# Patient Record
Sex: Female | Born: 1975 | Race: White | Hispanic: No | Marital: Single | Smoking: Current every day smoker
Health system: Southern US, Community
[De-identification: ages and names within clinical notes are randomized; demographics above are authoritative.]

## PROBLEM LIST (undated history)

## (undated) DIAGNOSIS — R519 Headache, unspecified: Secondary | ICD-10-CM

## (undated) DIAGNOSIS — T8859XA Other complications of anesthesia, initial encounter: Secondary | ICD-10-CM

## (undated) DIAGNOSIS — F419 Anxiety disorder, unspecified: Secondary | ICD-10-CM

## (undated) DIAGNOSIS — I10 Essential (primary) hypertension: Secondary | ICD-10-CM

## (undated) DIAGNOSIS — R51 Headache: Secondary | ICD-10-CM

## (undated) DIAGNOSIS — T4145XA Adverse effect of unspecified anesthetic, initial encounter: Secondary | ICD-10-CM

## (undated) DIAGNOSIS — E039 Hypothyroidism, unspecified: Secondary | ICD-10-CM

## (undated) HISTORY — PX: CHOLECYSTECTOMY: SHX55

## (undated) HISTORY — DX: Anxiety disorder, unspecified: F41.9

## (undated) HISTORY — DX: Hypothyroidism, unspecified: E03.9

## (undated) HISTORY — PX: TUBAL LIGATION: SHX77

---
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
## 2014-12-07 ENCOUNTER — Encounter (HOSPITAL_COMMUNITY): Payer: Self-pay | Admitting: *Deleted

## 2014-12-07 ENCOUNTER — Emergency Department (HOSPITAL_COMMUNITY)
Admission: EM | Admit: 2014-12-07 | Discharge: 2014-12-08 | Disposition: A | Discharge status: Home / Self Care | Payer: Medicaid Other | Attending: Emergency Medicine | Admitting: Emergency Medicine

## 2014-12-07 DIAGNOSIS — Z3A14 14 weeks gestation of pregnancy: Secondary | ICD-10-CM | POA: Insufficient documentation

## 2014-12-07 DIAGNOSIS — F17210 Nicotine dependence, cigarettes, uncomplicated: Secondary | ICD-10-CM | POA: Diagnosis not present

## 2014-12-07 DIAGNOSIS — O9989 Other specified diseases and conditions complicating pregnancy, childbirth and the puerperium: Secondary | ICD-10-CM | POA: Insufficient documentation

## 2014-12-07 DIAGNOSIS — M549 Dorsalgia, unspecified: Secondary | ICD-10-CM | POA: Diagnosis not present

## 2014-12-07 DIAGNOSIS — O99331 Smoking (tobacco) complicating pregnancy, first trimester: Secondary | ICD-10-CM | POA: Insufficient documentation

## 2014-12-07 DIAGNOSIS — O99891 Other specified diseases and conditions complicating pregnancy: Secondary | ICD-10-CM

## 2014-12-07 LAB — URINALYSIS, ROUTINE W REFLEX MICROSCOPIC
BILIRUBIN URINE: NEGATIVE
GLUCOSE, UA: NEGATIVE mg/dL
Hgb urine dipstick: NEGATIVE
Ketones, ur: NEGATIVE mg/dL
LEUKOCYTES UA: NEGATIVE
Nitrite: NEGATIVE
PH: 7 (ref 5.0–8.0)
Protein, ur: NEGATIVE mg/dL
SPECIFIC GRAVITY, URINE: 1.01 (ref 1.005–1.030)
Urobilinogen, UA: 0.2 mg/dL (ref 0.0–1.0)

## 2014-12-07 LAB — BASIC METABOLIC PANEL
Anion gap: 4 — ABNORMAL LOW (ref 5–15)
BUN: 8 mg/dL (ref 6–23)
CO2: 26 mmol/L (ref 19–32)
CREATININE: 0.55 mg/dL (ref 0.50–1.10)
Calcium: 8.6 mg/dL (ref 8.4–10.5)
Chloride: 102 mmol/L (ref 96–112)
GFR calc Af Amer: >90 mL/min (ref >90)
GLUCOSE: 89 mg/dL (ref 70–99)
Potassium: 4.3 mmol/L (ref 3.5–5.1)
Sodium: 132 mmol/L — ABNORMAL LOW (ref 135–145)

## 2014-12-07 LAB — CBC WITH DIFFERENTIAL/PLATELET
BASOS ABS: 0 10*3/uL (ref 0.0–0.1)
Basophils Relative: 0 % (ref 0–1)
Eosinophils Absolute: 0.2 10*3/uL (ref 0.0–0.7)
Eosinophils Relative: 1 % (ref 0–5)
HEMATOCRIT: 33.5 % — AB (ref 36.0–46.0)
Hemoglobin: 11.5 g/dL — ABNORMAL LOW (ref 12.0–15.0)
LYMPHS PCT: 21 % (ref 12–46)
Lymphs Abs: 2.9 10*3/uL (ref 0.7–4.0)
MCH: 31.3 pg (ref 26.0–34.0)
MCHC: 34.3 g/dL (ref 30.0–36.0)
MCV: 91 fL (ref 78.0–100.0)
MONO ABS: 0.8 10*3/uL (ref 0.1–1.0)
Monocytes Relative: 6 % (ref 3–12)
Neutro Abs: 9.9 10*3/uL — ABNORMAL HIGH (ref 1.7–7.7)
Neutrophils Relative %: 72 % (ref 43–77)
Platelets: 217 10*3/uL (ref 150–400)
RBC: 3.68 MIL/uL — AB (ref 3.87–5.11)
RDW: 13.4 % (ref 11.5–15.5)
WBC: 13.8 10*3/uL — AB (ref 4.0–10.5)

## 2014-12-07 LAB — PREGNANCY, URINE: PREG TEST UR: POSITIVE — AB

## 2014-12-07 MED ORDER — OXYCODONE-ACETAMINOPHEN 5-325 MG PO TABS
1.0000 | ORAL_TABLET | Freq: Once | ORAL | Status: AC
  Administered 2014-12-07: 1 via ORAL
  Filled 2014-12-07: qty 1

## 2014-12-07 MED ORDER — MORPHINE SULFATE 4 MG/ML IJ SOLN: 4.0000 mg | Freq: Once | INTRAMUSCULAR | Status: DC

## 2014-12-07 MED ORDER — ONDANSETRON 4 MG PO TBDP
4.0000 mg | ORAL_TABLET | Freq: Once | ORAL | Status: AC
  Administered 2014-12-07: 4 mg via ORAL
  Filled 2014-12-07: qty 1

## 2014-12-07 MED ORDER — ONDANSETRON HCL 4 MG/2ML IJ SOLN: 4.0000 mg | Freq: Once | INTRAMUSCULAR | Status: DC

## 2014-12-07 NOTE — ED Notes (Signed)
Pt states Urine smells foul, specimen sent to lab

## 2014-12-07 NOTE — ED Notes (Signed)
Pt states she is [redacted] weeks pregnant, has not seen OB yet. Wanting fetal heart tones checked. Seen urine to lab to confirm pregnancy

## 2014-12-07 NOTE — ED Notes (Signed)
Low back pain, "stabbing pain" for 3 days. Pt is  [redacted] weeks pregnant.  No vag bleeding, NV

## 2014-12-07 NOTE — ED Notes (Signed)
MD at bedside with Ultra sound, Fetal HR 122 Per MD

## 2014-12-08 ENCOUNTER — Ambulatory Visit (HOSPITAL_COMMUNITY)
Admit: 2014-12-08 | Discharge: 2014-12-08 | Disposition: A | Discharge status: Home / Self Care | Payer: Medicaid Other | Source: Ambulatory Visit | Attending: Emergency Medicine | Admitting: Emergency Medicine

## 2014-12-08 ENCOUNTER — Other Ambulatory Visit (HOSPITAL_COMMUNITY): Payer: Self-pay | Admitting: Emergency Medicine

## 2014-12-08 DIAGNOSIS — O2302 Infections of kidney in pregnancy, second trimester: Secondary | ICD-10-CM | POA: Insufficient documentation

## 2014-12-08 DIAGNOSIS — R109 Unspecified abdominal pain: Secondary | ICD-10-CM

## 2014-12-08 MED ORDER — HYDROCODONE-ACETAMINOPHEN 5-325 MG PO TABS: 1.0000 | ORAL_TABLET | Freq: Four times a day (QID) | ORAL | Status: DC | PRN

## 2014-12-08 NOTE — ED Notes (Signed)
Patient given discharge instruction, verbalized understand. Patient ambulatory out of the department.  

## 2014-12-08 NOTE — Discharge Instructions (Signed)
You were seen today for back pain during pregnancy. Urinalysis does not show any evidence of infection. Other considerations include kidney stones. He will need an ultrasound of your kidneys which will be arranged for you tomorrow. In the meantime you will be given pain medication for symptomatic relief.  Back Pain in Pregnancy Back pain during pregnancy is common. It happens in about half of all pregnancies. It is important for you and your baby that you remain active during your pregnancy.If you feel that back pain is not allowing you to remain active or sleep well, it is time to see your caregiver. Back pain may be caused by several factors related to changes during your pregnancy.Fortunately, unless you had trouble with your back before your pregnancy, the pain is likely to get better after you deliver. Low back pain usually occurs between the fifth and seventh months of pregnancy. It can, however, happen in the first couple months. Factors that increase the risk of back problems include:   Previous back problems.  Injury to your back.  Having twins or multiple births.  A chronic cough.  Stress.  Job-related repetitive motions.  Muscle or spinal disease in the back.  Family history of back problems, ruptured (herniated) discs, or osteoporosis.  Depression, anxiety, and panic attacks. CAUSES   When you are pregnant, your body produces a hormone called relaxin. This hormonemakes the ligaments connecting the low back and pubic bones more flexible. This flexibility allows the baby to be delivered more easily. When your ligaments are loose, your muscles need to work harder to support your back. Soreness in your back can come from tired muscles. Soreness can also come from back tissues that are irritated since they are receiving less support.  As the baby grows, it puts pressure on the nerves and blood vessels in your pelvis. This can cause back pain.  As the baby grows and gets heavier  during pregnancy, the uterus pushes the stomach muscles forward and changes your center of gravity. This makes your back muscles work harder to maintain good posture. SYMPTOMS  Lumbar pain during pregnancy Lumbar pain during pregnancy usually occurs at or above the waist in the center of the back. There may be pain and numbness that radiates into your leg or foot. This is similar to low back pain experienced by non-pregnant women. It usually increases with sitting for long periods of time, standing, or repetitive lifting. Tenderness may also be present in the muscles along your upper back. Posterior pelvic pain during pregnancy Pain in the back of the pelvis is more common than lumbar pain in pregnancy. It is a deep pain felt in your side at the waistline, or across the tailbone (sacrum), or in both places. You may have pain on one or both sides. This pain can also go into the buttocks and backs of the upper thighs. Pubic and groin pain may also be present. The pain does not quickly resolve with rest, and morning stiffness may also be present. Pelvic pain during pregnancy can be brought on by most activities. A high level of fitness before and during pregnancy may or may not prevent this problem. Labor pain is usually 1 to 2 minutes apart, lasts for about 1 minute, and involves a bearing down feeling or pressure in your pelvis. However, if you are at term with the pregnancy, constant low back pain can be the beginning of early labor, and you should be aware of this. DIAGNOSIS  X-rays of the back  should not be done during the first 12 to 14 weeks of the pregnancy and only when absolutely necessary during the rest of the pregnancy. MRIs do not give off radiation and are safe during pregnancy. MRIs also should only be done when absolutely necessary. HOME CARE INSTRUCTIONS  Exercise as directed by your caregiver. Exercise is the most effective way to prevent or manage back pain. If you have a back problem, it  is especially important to avoid sports that require sudden body movements. Swimming and walking are great activities.  Do not stand in one place for long periods of time.  Do not wear high heels.  Sit in chairs with good posture. Use a pillow on your lower back if necessary. Make sure your head rests over your shoulders and is not hanging forward.  Try sleeping on your side, preferably the left side, with a pillow or two between your legs. If you are sore after a night's rest, your bedmay betoo soft.Try placing a board between your mattress and box spring.  Listen to your body when lifting.If you are experiencing pain, ask for help or try bending yourknees more so you can use your leg muscles rather than your back muscles. Squat down when picking up something from the floor. Do not bend over.  Eat a healthy diet. Try to gain weight within your caregiver's recommendations.  Use heat or cold packs 3 to 4 times a day for 15 minutes to help with the pain.  Only take over-the-counter or prescription medicines for pain, discomfort, or fever as directed by your caregiver. Sudden (acute) back pain  Use bed rest for only the most extreme, acute episodes of back pain. Prolonged bed rest over 48 hours will aggravate your condition.  Ice is very effective for acute conditions.  Put ice in a plastic bag.  Place a towel between your skin and the bag.  Leave the ice on for 10 to 20 minutes every 2 hours, or as needed.  Using heat packs for 30 minutes prior to activities is also helpful. Continued back pain See your caregiver if you have continued problems. Your caregiver can help or refer you for appropriate physical therapy. With conditioning, most back problems can be avoided. Sometimes, a more serious issue may be the cause of back pain. You should be seen right away if new problems seem to be developing. Your caregiver may recommend:  A maternity girdle.  An elastic sling.  A back  brace.  A massage therapist or acupuncture. SEEK MEDICAL CARE IF:   You are not able to do most of your daily activities, even when taking the pain medicine you were given.  You need a referral to a physical therapist or chiropractor.  You want to try acupuncture. SEEK IMMEDIATE MEDICAL CARE IF:  You develop numbness, tingling, weakness, or problems with the use of your arms or legs.  You develop severe back pain that is no longer relieved with medicines.  You have a sudden change in bowel or bladder control.  You have increasing pain in other areas of the body.  You develop shortness of breath, dizziness, or fainting.  You develop nausea, vomiting, or sweating.  You have back pain which is similar to labor pains.  You have back pain along with your water breaking or vaginal bleeding.  You have back pain or numbness that travels down your leg.  Your back pain developed after you fell.  You develop pain on one side of your  back. You may have a kidney stone.  You see blood in your urine. You may have a bladder infection or kidney stone.  You have back pain with blisters. You may have shingles. Back pain is fairly common during pregnancy but should not be accepted as just part of the process. Back pain should always be treated as soon as possible. This will make your pregnancy as pleasant as possible. Document Released: 11/15/2005 Document Revised: 10/30/2011 Document Reviewed: 12/27/2010 [facility] Patient Information 2015 [place], [place]. This information is not intended to replace advice given to you by your health care provider. Make sure you discuss any questions you have with your health care provider.

## 2014-12-08 NOTE — ED Notes (Signed)
U/S appointment made, instruction given to drink 32 oz of water and do not void.

## 2014-12-08 NOTE — ED Provider Notes (Signed)
CSN: [id number]     Arrival date & time 12/07/14  2006 History   First MD Initiated Contact with Patient 12/07/14 2254     Chief Complaint  Patient presents with  . Back Pain     (Consider location/radiation/quality/duration/timing/severity/associated sxs/prior Treatment) HPI  This is a G5 P4 female currently approximately [redacted] weeks pregnant who presents with left lower back pain. Patient reports back pain "some time."  She reports that the pain has been ongoing over the last 2-3 weeks but worsened over the last 3 days. Reports that the pain is stabbing in her left flank. Currently 8 out of 10. She reports a strong smell to her urine but denies any dysuria or hematuria. Denies any fevers. She reports that she has had some associated nausea and vomiting. Pain is sometimes worse with movement. No diarrhea. Patient just moved from [facility] and is establishing OB care. She has not had a ultrasound yet. Denies any vaginal discharge or bleeding. Denies any lower abdominal pain.  Past Medical History  Diagnosis Date  . Pregnant    Past Surgical History  Procedure Laterality Date  . Cholecystectomy     History reviewed. No pertinent family history. History  Substance Use Topics  . Smoking status: Current Every Day Smoker  . Smokeless tobacco: Not on file  . Alcohol Use: No   OB History    Gravida Para Term Preterm AB TAB SAB Ectopic Multiple Living   1              Review of Systems  Constitutional: Negative for fever.  Respiratory: Negative for chest tightness and shortness of breath.   Cardiovascular: Negative for chest pain.  Gastrointestinal: Negative for nausea, vomiting and abdominal pain.  Genitourinary: Positive for flank pain. Negative for dysuria, hematuria, vaginal bleeding, vaginal discharge and pelvic pain.  Musculoskeletal: Negative for back pain.  Neurological: Negative for headaches.  All other systems reviewed and are negative.     Allergies   Ciprofloxacin and Doxycycline  Home Medications   Prior to Admission medications   Medication Sig Start Date End Date Taking? Authorizing Provider  HYDROcodone-acetaminophen (NORCO/VICODIN) 5-325 MG per tablet Take 1 tablet by mouth every 6 (six) hours as needed. 12/08/14   Shon Baton, MD   BP 95/53 mmHg  Pulse 65  Temp(Src) 98.3 F (36.8 C) (Oral)  Resp 20  Ht  (1.575 m)  Wt 137 lb (62.143 kg)  BMI 25.05 kg/m2  SpO2 95%  LMP 08/31/2014 (Exact Date) Physical Exam  Constitutional: She is oriented to person, place, and time. She appears well-developed and well-nourished. No distress.  HENT:  Head: Normocephalic and atraumatic.  Cardiovascular: Normal rate, regular rhythm and normal heart sounds.   No murmur heard. Pulmonary/Chest: Effort normal and breath sounds normal. No respiratory distress. She has no wheezes.  Abdominal: Soft. Bowel sounds are normal. There is no tenderness. There is no rebound.  No abdominal tenderness, uterine fundus palpated just above the pelvic brim, left flank tenderness to palpation  Neurological: She is alert and oriented to person, place, and time.  Skin: Skin is warm and dry.  Psychiatric: She has a normal mood and affect.  Nursing note and vitals reviewed.   ED Course  Procedures (including critical care time)  EMERGENCY DEPARTMENT [place] PREGNANCY "Study: Limited Ultrasound of the Pelvis"  INDICATIONS:Pregnancy(required) Multiple views of the uterus and pelvic cavity are obtained with a multi-frequency probe.  APPROACH:Transabdominal   PERFORMED BY: Myself  IMAGES ARCHIVED?:  No  LIMITATIONS: Emergent procedure  PREGNANCY FREE FLUID: None  PREGNANCY UTERUS FINDINGS:Uterus normal size   FETAL HEART RATE: 122     Labs Review Labs Reviewed  CBC WITH DIFFERENTIAL/PLATELET - Abnormal; Notable for the following:    WBC 13.8 (*)    RBC 3.68 (*)    Hemoglobin 11.5 (*)    HCT 33.5 (*)    Neutro Abs 9.9 (*)    All other  components within normal limits  BASIC METABOLIC PANEL - Abnormal; Notable for the following:    Sodium 132 (*)    Anion gap 4 (*)    All other components within normal limits  PREGNANCY, URINE - Abnormal; Notable for the following:    Preg Test, Ur POSITIVE (*)    All other components within normal limits  URINALYSIS, ROUTINE W REFLEX MICROSCOPIC    Imaging Review No results found.   EKG Interpretation None      MDM   Final diagnoses:  Back pain in pregnancy    Patient presents with back pain during pregnancy. It has been ongoing for several weeks but worsening of the last several days. Is nontoxic on exam and vital signs are reassuring. Patient does have left flank tenderness to palpation. Bedside ultrasound revealed fetal fetal heart rate of 122. Urinalysis without evidence of infection suggestive of pyelonephritis. Lab work is otherwise reassuring. Patient does have a mild leukocytosis but this may be related to normal pregnancy. Considerations include musculoskeletal pain versus possible kidney stone. Will have patient return for a renal ultrasound tomorrow. Patient will be given pain control meantime.  After history, exam, and medical workup I feel the patient has been appropriately medically screened and is safe for discharge home. Pertinent diagnoses were discussed with the patient. Patient was given return precautions.     Shon Batonourtney F Iren Whipp, MD 12/08/14 [phone]

## 2014-12-09 NOTE — ED Notes (Signed)
Pt called and stated she was told by the EDP she could return to work on Friday. States she did not get a note to return to work and request one. Note given for pt to return on Friday

## 2015-07-14 ENCOUNTER — Emergency Department (HOSPITAL_COMMUNITY)
Admission: EM | Admit: 2015-07-14 | Discharge: 2015-07-14 | Disposition: A | Discharge status: Home / Self Care | Payer: Medicaid Other | Attending: Emergency Medicine | Admitting: Emergency Medicine

## 2015-07-14 ENCOUNTER — Encounter (HOSPITAL_COMMUNITY): Payer: Self-pay | Admitting: *Deleted

## 2015-07-14 DIAGNOSIS — J029 Acute pharyngitis, unspecified: Secondary | ICD-10-CM | POA: Diagnosis not present

## 2015-07-14 DIAGNOSIS — J3489 Other specified disorders of nose and nasal sinuses: Secondary | ICD-10-CM | POA: Diagnosis not present

## 2015-07-14 DIAGNOSIS — F17200 Nicotine dependence, unspecified, uncomplicated: Secondary | ICD-10-CM | POA: Diagnosis not present

## 2015-07-14 DIAGNOSIS — H9203 Otalgia, bilateral: Secondary | ICD-10-CM | POA: Insufficient documentation

## 2015-07-14 DIAGNOSIS — H53149 Visual discomfort, unspecified: Secondary | ICD-10-CM | POA: Insufficient documentation

## 2015-07-14 DIAGNOSIS — I10 Essential (primary) hypertension: Secondary | ICD-10-CM | POA: Insufficient documentation

## 2015-07-14 DIAGNOSIS — R51 Headache: Secondary | ICD-10-CM | POA: Diagnosis not present

## 2015-07-14 DIAGNOSIS — R519 Headache, unspecified: Secondary | ICD-10-CM

## 2015-07-14 HISTORY — DX: Essential (primary) hypertension: I10

## 2015-07-14 MED ORDER — TRIAMCINOLONE ACETONIDE 55 MCG/ACT NA AERO: INHALATION_SPRAY | NASAL | Status: DC

## 2015-07-14 MED ORDER — CETIRIZINE HCL 10 MG PO CAPS: 10.0000 mg | ORAL_CAPSULE | Freq: Every day | ORAL | Status: AC | PRN

## 2015-07-14 MED ORDER — METOCLOPRAMIDE HCL 5 MG/ML IJ SOLN
10.0000 mg | Freq: Once | INTRAMUSCULAR | Status: AC
  Administered 2015-07-14: 10 mg via INTRAMUSCULAR
  Filled 2015-07-14: qty 2

## 2015-07-14 MED ORDER — KETOROLAC TROMETHAMINE 60 MG/2ML IM SOLN
60.0000 mg | Freq: Once | INTRAMUSCULAR | Status: AC
  Administered 2015-07-14: 60 mg via INTRAMUSCULAR
  Filled 2015-07-14: qty 2

## 2015-07-14 MED ORDER — DIPHENHYDRAMINE HCL 50 MG/ML IJ SOLN
25.0000 mg | Freq: Once | INTRAMUSCULAR | Status: AC
  Administered 2015-07-14: 25 mg via INTRAMUSCULAR
  Filled 2015-07-14: qty 1

## 2015-07-14 NOTE — ED Notes (Signed)
Pt c/o headache and earache x 2 days

## 2015-07-14 NOTE — ED Provider Notes (Signed)
CSN: [id number]     Arrival date & time 07/14/15  0025 History   First MD Initiated Contact with Patient 07/14/15 0130    Chief Complaint  Patient presents with  . URI     (Consider location/radiation/quality/duration/timing/severity/associated sxs/prior Treatment) HPI patient states a couple days ago she started getting headache and she indicates she is having pressure pain over her frontal and maxillary sinuses. She states if she bends over the pain gets a lot worse and starts pounding harder. She states she's having photophobia and noise sensitivity. She states even walking makes her head pounding and throbbing more. She states nothing makes it feel better. She states she's having bilateral ear pain today. She denies coughing, sneezing, rhinorrhea, nausea, vomiting, diarrhea, or fever. She states her nose is stuffy and she feels like she can't breathe through her nose. She has a mild sore throat. She states she has taken ibuprofen and Tylenol without relief.   PCP Dr Lysbeth GalasNyland  Past Medical History  Diagnosis Date  . Pregnant   . Hypertension    Past Surgical History  Procedure Laterality Date  . Cholecystectomy    . Cesarean section      x2   History reviewed. No pertinent family history. Social History  Substance Use Topics  . Smoking status: Current Every Day Smoker -- 0.50 packs/day  . Smokeless tobacco: None  . Alcohol Use: No   employed  OB History    Gravida Para Term Preterm AB TAB SAB Ectopic Multiple Living   1              Review of Systems  All other systems reviewed and are negative.     Allergies  Ciprofloxacin and Doxycycline  Home Medications   Prior to Admission medications   Medication Sig Start Date End Date Taking? Authorizing Provider  Cetirizine HCl (ZYRTEC ALLERGY) 10 MG CAPS Take 1 capsule (10 mg total) by mouth daily as needed. 07/14/15   Devoria AlbeIva Scorpio Fortin, MD  HYDROcodone-acetaminophen (NORCO/VICODIN) 5-325 MG per tablet Take 1 tablet by mouth  every 6 (six) hours as needed. 12/08/14   Shon Batonourtney F Horton, MD  triamcinolone (NASACORT AQ) 55 MCG/ACT AERO nasal inhaler 2 sprays in each nostril daily 07/14/15   Devoria AlbeIva Gustava Berland, MD   BP 118/87 mmHg  Pulse 76  Temp(Src) 98.2 F (36.8 C) (Oral)  Resp 18  Ht 4\' 11"  (1.499 m)  Wt 152 lb (68.947 kg)  BMI 30.68 kg/m2  SpO2 100%  LMP 08/16/2014  Breastfeeding? Unknown  Vital signs normal    Physical Exam  Constitutional: She is oriented to person, place, and time. She appears well-developed and well-nourished.  Non-toxic appearance. She does not appear ill. No distress.  HENT:  Head: Normocephalic and atraumatic.  Right Ear: External ear normal.  Left Ear: External ear normal.  Nose: Rhinorrhea present. No mucosal edema. Right sinus exhibits maxillary sinus tenderness and frontal sinus tenderness. Left sinus exhibits maxillary sinus tenderness and frontal sinus tenderness.  Mouth/Throat: Oropharynx is clear and moist and mucous membranes are normal. No dental abscesses or uvula swelling.  Patient has bilateral bogginess and redness in her nares  Eyes: Conjunctivae and EOM are normal. Pupils are equal, round, and reactive to light.  Neck: Normal range of motion and full passive range of motion without pain. Neck supple.  Cardiovascular: Normal rate, regular rhythm and normal heart sounds.  Exam reveals no gallop and no friction rub.   No murmur heard. Pulmonary/Chest: Effort normal and breath sounds normal.  No respiratory distress. She has no wheezes. She has no rhonchi. She has no rales. She exhibits no tenderness and no crepitus.  Abdominal: Normal appearance.  Musculoskeletal: Normal range of motion. She exhibits no edema or tenderness.  Moves all extremities well.   Neurological: She is alert and oriented to person, place, and time. She has normal strength. No cranial nerve deficit.  Skin: Skin is warm, dry and intact. No rash noted. No erythema. No pallor.  Psychiatric: She has a  normal mood and affect. Her speech is normal and behavior is normal. Her mood appears not anxious.  Nursing note and vitals reviewed.   ED Course  Procedures (including critical care time)  Medications  ketorolac (TORADOL) injection 60 mg (60 mg Intramuscular Given 07/14/15 0143)  metoCLOPramide (REGLAN) injection 10 mg (10 mg Intramuscular Given 07/14/15 0144)  diphenhydrAMINE (BENADRYL) injection 25 mg (25 mg Intramuscular Given 07/14/15 0143)   Patient states she needs to go home because her daughter who drove her needs to go to work. She was given migraine cocktail IM. Does state she is having a viral sinus infection. She can take steroid nasal spray and Zyrtec over-the-counter for her symptoms. She should be rechecked if she gets a high fever or if she's not improving over the next week to 10 days.   MDM   Final diagnoses:  Sinus headache    New Prescriptions   CETIRIZINE HCL (ZYRTEC ALLERGY) 10 MG CAPS    Take 1 capsule (10 mg total) by mouth daily as needed.   TRIAMCINOLONE (NASACORT AQ) 55 MCG/ACT AERO NASAL INHALER    2 sprays in each nostril daily    Plan discharge  Devoria Albe, MD, Concha Pyo, MD 07/14/15 [phone]

## 2015-07-14 NOTE — Discharge Instructions (Signed)
Use the nasal spray and the allergy medication to help reduce the swelling in your nose and to help your sinuses drain. Use heat on your face for comfort. You can take ibuprofen 600 mg + acetaminophen 1000 mg 4 times a day for pain. This is a viral illness and antibiotics are not going to help your symptoms. Let Dr Lysbeth GalasNyland know if you aren't improving in the next 7-10 days or if you get a high fever and at that point, antibiotics might be indicated.    Sinus Headache A sinus headache occurs when the paranasal sinuses become clogged or swollen. Paranasal sinuses are air pockets within the bones of the face. Sinus headaches can range from mild to severe. CAUSES A sinus headache can result from various conditions that affect the sinuses, such as:  Colds.  Sinus infections.  Allergies. SYMPTOMS The main symptom of this condition is a headache that may feel like pain or pressure in the face, forehead, ears, or upper teeth. People who have a sinus headache often have other symptoms, such as:  Congested or runny nose.  Fever.  Inability to smell. Weather changes can make symptoms worse. DIAGNOSIS This condition may be diagnosed based on:  A physical exam and medical history.  Imaging tests, such as a CT scan and MRI, to check for problems with the sinuses.  A specialist may look into the sinuses with a tool that has a camera (endoscopy). TREATMENT Treatment for this condition depends on the cause.  Sinus pain that is caused by a sinus infection may be treated with antibiotic medicine.  Sinus pain that is caused by allergies may be helped by allergy medicines (antihistamines) and medicated nasal sprays.  Sinus pain that is caused by congestion may be helped by flushing the nose and sinuses with saline solution. HOME CARE INSTRUCTIONS  Take medicines only as directed by your health care provider.  If you were prescribed an antibiotic medicine, finish all of it even if you start to  feel better.  If you have congestion, use a nasal spray to help reduce pressure.  If directed, apply a warm, moist washcloth to your face to help relieve pain. SEEK MEDICAL CARE IF:  You have headaches more than one time each week.  You have sensitivity to light or sound.  You have a fever.  You feel sick to your stomach (nauseous) or you throw up (vomit).  Your headaches do not get better with treatment. Many people think that they have a sinus headache when they actually have migraines or tension headaches. SEEK IMMEDIATE MEDICAL CARE IF:  You have vision problems.  You have sudden, severe pain in your face or head.  You have a seizure.  You are confused.  You have a stiff neck.   This information is not intended to replace advice given to you by your health care provider. Make sure you discuss any questions you have with your health care provider.   Document Released: 09/14/2004 Document Revised: 12/22/2014 Document Reviewed: 08/03/2014 Elsevier Interactive Patient Education [employer]

## 2016-03-21 HISTORY — PX: CERVICAL BIOPSY: SHX590

## 2016-04-03 ENCOUNTER — Ambulatory Visit: Payer: Medicaid Other | Admitting: Gynecology

## 2016-04-19 ENCOUNTER — Ambulatory Visit: Payer: Medicaid Other | Attending: Gynecologic Oncology | Admitting: Gynecologic Oncology

## 2016-04-19 ENCOUNTER — Telehealth: Payer: Self-pay | Admitting: *Deleted

## 2016-04-19 NOTE — Telephone Encounter (Signed)
Dr. Ralph DowdyBuist office contacted, spoke with Katelyn DikeJennifer notified her that patient has been a No show twice for New patient appointment. Advised jennifer to please contact the patient and contact our office if the patient would like to be rescheduled.

## 2016-05-12 ENCOUNTER — Ambulatory Visit: Payer: Medicaid Other | Admitting: Gynecologic Oncology

## 2016-05-12 ENCOUNTER — Encounter: Payer: Self-pay | Admitting: Gynecologic Oncology

## 2016-05-19 ENCOUNTER — Ambulatory Visit: Payer: Medicaid Other | Attending: Gynecology | Admitting: Gynecology

## 2016-05-19 ENCOUNTER — Encounter: Payer: Self-pay | Admitting: Gynecology

## 2016-05-19 VITALS — BP 119/86 | HR 71 | Temp 98.5°F | Resp 18 | Ht 62.0 in | Wt 142.4 lb

## 2016-05-19 DIAGNOSIS — N903 Dysplasia of vulva, unspecified: Secondary | ICD-10-CM | POA: Diagnosis present

## 2016-05-19 DIAGNOSIS — R1032 Left lower quadrant pain: Secondary | ICD-10-CM

## 2016-05-19 DIAGNOSIS — N920 Excessive and frequent menstruation with regular cycle: Secondary | ICD-10-CM

## 2016-05-19 DIAGNOSIS — Z79899 Other long term (current) drug therapy: Secondary | ICD-10-CM | POA: Insufficient documentation

## 2016-05-19 DIAGNOSIS — F17210 Nicotine dependence, cigarettes, uncomplicated: Secondary | ICD-10-CM | POA: Diagnosis not present

## 2016-05-19 DIAGNOSIS — R896 Abnormal cytological findings in specimens from other organs, systems and tissues: Secondary | ICD-10-CM | POA: Diagnosis not present

## 2016-05-19 DIAGNOSIS — IMO0002 Reserved for concepts with insufficient information to code with codable children: Secondary | ICD-10-CM

## 2016-05-19 NOTE — Patient Instructions (Signed)
Plan for CO2 laser of the vulva the morning of June 09, 2016 with Dr. [facility]  You will receive a phone call from the pre-surgical RN to discuss instructions several days before your surgery.  Follow up with Dr. Ralph DowdyBuist about the left pelvic pain.  Please call our office for any needs or concerns.

## 2016-05-19 NOTE — Progress Notes (Signed)
Consult Note: Gyn-Onc   Katelyn Thornton 40 y.o. female  Chief Complaint  Patient presents with  . 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 Left lower quadrant pain and menorrhagia.  Plan: With regard to the vulvar dysplasia recommend she have laser ablation of these isolated lesions. We will schedule that as an outpatient on October 20 at [facility]. Postoperative management with tea sitz baths and Silvadene was discussed with the patient and she understands this take several weeks to heal.  With regard to the left lower quadrant pain of 3 weeks' duration and her menorrhagia I would suggest she return to Dr. for ultrasound evaluation and other management.    HPI:The patient is seen in consultation at the request of Dr. Ernestina PennaNigel Buist regarding management of severe dysplasia. The patient is had lesions on her vulva for number of years. However she finally was evaluated by her gynecologist. Biopsies revealed this to be a severe dysplasia.  Patient has remote history of abnormal Pap smear treated with a LEEP procedure in 1995.  Obstetrical history the patient has 5 living children. She's had 3 cesarean sections. It is noted that the patient's a smoker although she is trying to reduce her smoking and is only having approximately 5 cigarettes a day.  Review of Systems:10 point review of systems is negative except as noted in interval history.   Vitals: Blood pressure 119/86, pulse 71, temperature 98.5 F (36.9 C), temperature source Oral, resp. rate 18, height 5\' 2"  (1.575 m), weight 142 lb 7 oz (64.6 kg), unknown if currently breastfeeding.  Physical Exam: General : The patient is a healthy woman in no acute distress.  HEENT: normocephalic, extraoccular movements normal; neck is supple without thyromegally  Lynphnodes: Supraclavicular and inguinal nodes not enlarged  Abdomen: Soft, non-tender, no  ascites, no organomegally, no masses, no hernias  Pelvic:  EGBUS: Normal female. On the mons there are number of hyperpigmented areas. These been previously biopsied and found to be severe dysplasia. They're not necessarily typical of severe dysplasia and certainly are not verrucous in nature. Vagina: Normal, no lesions  Urethra and Bladder: Normal, non-tender  Cervix: Normal  Uterus: anterior normal shape size and consistency  Bi-manual examination: The left adnexal region is very tender. I do not feel a mass although exam is difficult because the patient's guarding.  Rectal: normal sphincter tone, no masses, no blood  Lower extremities: No edema or varicosities. Normal range of motion      Allergies  Allergen Reactions  . Ciprofloxacin Hives and Shortness Of Breath  . Doxycycline Hives and Shortness Of Breath    Past Medical History:  Diagnosis Date  . Anxiety   . Hypertension   . Pregnant     Past Surgical History:  Procedure Laterality Date  . CESAREAN SECTION     x2  . CHOLECYSTECTOMY      Current Outpatient Prescriptions  Medication Sig Dispense Refill  . amLODipine (NORVASC) 10 MG tablet Take by mouth.    . clonazePAM (KLONOPIN) 1 MG tablet TAKE ONE TABLET BY MOUTH TWICE DAILY AS NEEDED FOR ANXIETY    . metoprolol tartrate (LOPRESSOR) 25 MG tablet TAKE ONE TABLET BY MOUTH 4 TIMES DAILY    . Cetirizine HCl (ZYRTEC ALLERGY) 10 MG CAPS Take 1 capsule (10 mg total) by mouth daily as needed. (Patient not taking: Reported on 05/19/2016) 14 capsule 0   No current facility-administered medications  for this visit.     Social History   Social History  . Marital status: Single    Spouse name: N/A  . Number of children: N/A  . Years of education: N/A   Occupational History  . Not on file.   Social History Main Topics  . Smoking status: Current Every Day Smoker    Packs/day: 0.25    Types: Cigarettes  . Smokeless tobacco: Never Used  . Alcohol use No  . Drug use:  No  . Sexual activity: Yes    Birth control/ protection: None   Other Topics Concern  . Not on file   Social History Narrative  . No narrative on file    Family History  Problem Relation Age of Onset  . Cancer Mother   . Stroke Mother   . Cancer Father   . Cancer Sister   . Cancer Maternal Grandfather       De Blanchaniel Clarke-Pearson, MD 05/19/2016, 3:08 PM

## 2016-06-06 ENCOUNTER — Encounter (HOSPITAL_BASED_OUTPATIENT_CLINIC_OR_DEPARTMENT_OTHER): Payer: Self-pay | Admitting: *Deleted

## 2016-06-06 NOTE — Progress Notes (Signed)
Pt instructed npo pmn 10/19 x lopressor, klonopin w sip of water.  [facility] @ 0630.  Needs ekg, istat, urine hcg on arrival.

## 2016-06-09 ENCOUNTER — Encounter (HOSPITAL_BASED_OUTPATIENT_CLINIC_OR_DEPARTMENT_OTHER): Payer: Self-pay | Admitting: *Deleted

## 2016-06-09 ENCOUNTER — Encounter (HOSPITAL_BASED_OUTPATIENT_CLINIC_OR_DEPARTMENT_OTHER)
Admission: RE | Disposition: A | Discharge status: Home / Self Care | Payer: Self-pay | Source: Ambulatory Visit | Attending: Gynecology

## 2016-06-09 ENCOUNTER — Ambulatory Visit (HOSPITAL_BASED_OUTPATIENT_CLINIC_OR_DEPARTMENT_OTHER)
Admission: RE | Admit: 2016-06-09 | Discharge: 2016-06-09 | Disposition: A | Discharge status: Home / Self Care | Payer: Medicaid Other | Source: Ambulatory Visit | Attending: Gynecology | Admitting: Gynecology

## 2016-06-09 ENCOUNTER — Ambulatory Visit (HOSPITAL_BASED_OUTPATIENT_CLINIC_OR_DEPARTMENT_OTHER): Payer: Medicaid Other | Admitting: Anesthesiology

## 2016-06-09 ENCOUNTER — Other Ambulatory Visit: Payer: Self-pay | Admitting: Gynecologic Oncology

## 2016-06-09 ENCOUNTER — Other Ambulatory Visit: Payer: Self-pay

## 2016-06-09 DIAGNOSIS — I10 Essential (primary) hypertension: Secondary | ICD-10-CM | POA: Diagnosis not present

## 2016-06-09 DIAGNOSIS — D071 Carcinoma in situ of vulva: Secondary | ICD-10-CM

## 2016-06-09 DIAGNOSIS — G8918 Other acute postprocedural pain: Secondary | ICD-10-CM

## 2016-06-09 DIAGNOSIS — Z79899 Other long term (current) drug therapy: Secondary | ICD-10-CM | POA: Insufficient documentation

## 2016-06-09 DIAGNOSIS — F17210 Nicotine dependence, cigarettes, uncomplicated: Secondary | ICD-10-CM | POA: Diagnosis not present

## 2016-06-09 DIAGNOSIS — F419 Anxiety disorder, unspecified: Secondary | ICD-10-CM | POA: Insufficient documentation

## 2016-06-09 HISTORY — PX: CO2 LASER APPLICATION: SHX5778

## 2016-06-09 HISTORY — DX: Headache, unspecified: R51.9

## 2016-06-09 HISTORY — DX: Headache: R51

## 2016-06-09 LAB — POCT I-STAT, CHEM 8
BUN: 10 mg/dL (ref 6–20)
CHLORIDE: 106 mmol/L (ref 101–111)
CREATININE: 0.8 mg/dL (ref 0.44–1.00)
Calcium, Ion: 1.2 mmol/L (ref 1.15–1.40)
Glucose, Bld: 92 mg/dL (ref 65–99)
HEMATOCRIT: 43 % (ref 36.0–46.0)
HEMOGLOBIN: 14.6 g/dL (ref 12.0–15.0)
POTASSIUM: 3.7 mmol/L (ref 3.5–5.1)
Sodium: 143 mmol/L (ref 135–145)
TCO2: 22 mmol/L (ref 0–100)

## 2016-06-09 LAB — POCT PREGNANCY, URINE: Preg Test, Ur: NEGATIVE

## 2016-06-09 SURGERY — CO2 LASER APPLICATION
Anesthesia: General | Site: Vulva

## 2016-06-09 MED ORDER — OXYCODONE HCL 5 MG PO TABS
ORAL_TABLET | ORAL | Status: AC
  Filled 2016-06-09: qty 1

## 2016-06-09 MED ORDER — LIDOCAINE HCL (CARDIAC) 20 MG/ML IV SOLN
INTRAVENOUS | Status: DC | PRN
  Administered 2016-06-09: 100 mg via INTRAVENOUS

## 2016-06-09 MED ORDER — ARTIFICIAL TEARS OP OINT
TOPICAL_OINTMENT | OPHTHALMIC | Status: AC
  Filled 2016-06-09: qty 3.5

## 2016-06-09 MED ORDER — EPHEDRINE SULFATE 50 MG/ML IJ SOLN
INTRAMUSCULAR | Status: DC | PRN
  Administered 2016-06-09: 10 mg via INTRAVENOUS
  Administered 2016-06-09: 15 mg via INTRAVENOUS

## 2016-06-09 MED ORDER — ACETIC ACID 5 % SOLN
Status: DC | PRN
  Administered 2016-06-09: 1 via TOPICAL

## 2016-06-09 MED ORDER — LIDOCAINE 2% (20 MG/ML) 5 ML SYRINGE
INTRAMUSCULAR | Status: AC
  Filled 2016-06-09: qty 5

## 2016-06-09 MED ORDER — ACETAMINOPHEN 325 MG PO TABS
650.0000 mg | ORAL_TABLET | ORAL | Status: DC | PRN
  Filled 2016-06-09: qty 2

## 2016-06-09 MED ORDER — LACTATED RINGERS IV SOLN
INTRAVENOUS | Status: DC
  Administered 2016-06-09 (×3): via INTRAVENOUS
  Filled 2016-06-09: qty 1000

## 2016-06-09 MED ORDER — KETOROLAC TROMETHAMINE 30 MG/ML IJ SOLN
INTRAMUSCULAR | Status: AC
  Filled 2016-06-09: qty 1

## 2016-06-09 MED ORDER — FENTANYL CITRATE (PF) 100 MCG/2ML IJ SOLN
25.0000 ug | INTRAMUSCULAR | Status: DC | PRN
  Administered 2016-06-09 (×4): 50 ug via INTRAVENOUS
  Filled 2016-06-09: qty 1

## 2016-06-09 MED ORDER — MIDAZOLAM HCL 5 MG/5ML IJ SOLN
INTRAMUSCULAR | Status: DC | PRN
  Administered 2016-06-09 (×3): 1 mg via INTRAVENOUS

## 2016-06-09 MED ORDER — LIDOCAINE-EPINEPHRINE 1 %-1:100000 IJ SOLN
INTRAMUSCULAR | Status: DC | PRN
  Administered 2016-06-09: 10 mL

## 2016-06-09 MED ORDER — STERILE WATER FOR IRRIGATION IR SOLN
Status: DC | PRN
  Administered 2016-06-09: 500 mL

## 2016-06-09 MED ORDER — MIDAZOLAM HCL 2 MG/2ML IJ SOLN
INTRAMUSCULAR | Status: AC
  Filled 2016-06-09: qty 2

## 2016-06-09 MED ORDER — DEXAMETHASONE SODIUM PHOSPHATE 4 MG/ML IJ SOLN
INTRAMUSCULAR | Status: DC | PRN
  Administered 2016-06-09: 10 mg via INTRAVENOUS

## 2016-06-09 MED ORDER — ACETAMINOPHEN 650 MG RE SUPP
650.0000 mg | RECTAL | Status: DC | PRN
  Filled 2016-06-09: qty 1

## 2016-06-09 MED ORDER — KETOROLAC TROMETHAMINE 30 MG/ML IJ SOLN
30.0000 mg | Freq: Once | INTRAMUSCULAR | Status: AC | PRN
  Administered 2016-06-09: 30 mg via INTRAVENOUS
  Filled 2016-06-09: qty 1

## 2016-06-09 MED ORDER — PROPOFOL 10 MG/ML IV BOLUS
INTRAVENOUS | Status: DC | PRN
  Administered 2016-06-09: 200 mg via INTRAVENOUS

## 2016-06-09 MED ORDER — PROMETHAZINE HCL 25 MG/ML IJ SOLN
INTRAMUSCULAR | Status: AC
  Filled 2016-06-09: qty 1

## 2016-06-09 MED ORDER — OXYCODONE HCL 5 MG PO TABS
5.0000 mg | ORAL_TABLET | ORAL | Status: DC | PRN
  Filled 2016-06-09: qty 2

## 2016-06-09 MED ORDER — DEXAMETHASONE SODIUM PHOSPHATE 10 MG/ML IJ SOLN
INTRAMUSCULAR | Status: AC
  Filled 2016-06-09: qty 1

## 2016-06-09 MED ORDER — HYDROCODONE-ACETAMINOPHEN 5-325 MG PO TABS: 1.0000 | ORAL_TABLET | Freq: Four times a day (QID) | ORAL | 0 refills | Status: DC | PRN

## 2016-06-09 MED ORDER — PROPOFOL 10 MG/ML IV BOLUS
INTRAVENOUS | Status: AC
  Filled 2016-06-09: qty 20

## 2016-06-09 MED ORDER — EPHEDRINE 5 MG/ML INJ
INTRAVENOUS | Status: AC
  Filled 2016-06-09: qty 10

## 2016-06-09 MED ORDER — ONDANSETRON HCL 4 MG/2ML IJ SOLN
INTRAMUSCULAR | Status: DC | PRN
  Administered 2016-06-09: 4 mg via INTRAVENOUS

## 2016-06-09 MED ORDER — FENTANYL CITRATE (PF) 100 MCG/2ML IJ SOLN
INTRAMUSCULAR | Status: AC
  Filled 2016-06-09: qty 2

## 2016-06-09 MED ORDER — SODIUM CHLORIDE 0.9% FLUSH
3.0000 mL | Freq: Two times a day (BID) | INTRAVENOUS | Status: DC
  Filled 2016-06-09: qty 3

## 2016-06-09 MED ORDER — PROMETHAZINE HCL 25 MG/ML IJ SOLN
6.2500 mg | INTRAMUSCULAR | Status: DC | PRN
  Administered 2016-06-09: 6.25 mg via INTRAVENOUS
  Filled 2016-06-09: qty 1

## 2016-06-09 MED ORDER — SODIUM CHLORIDE 0.9 % IV SOLN
250.0000 mL | INTRAVENOUS | Status: DC | PRN
  Filled 2016-06-09: qty 250

## 2016-06-09 MED ORDER — ONDANSETRON HCL 4 MG/2ML IJ SOLN
INTRAMUSCULAR | Status: AC
  Filled 2016-06-09: qty 2

## 2016-06-09 MED ORDER — SODIUM CHLORIDE 0.9% FLUSH
3.0000 mL | INTRAVENOUS | Status: DC | PRN
  Filled 2016-06-09: qty 3

## 2016-06-09 MED ORDER — ACETAMINOPHEN 500 MG PO TABS
1000.0000 mg | ORAL_TABLET | Freq: Four times a day (QID) | ORAL | Status: DC
  Filled 2016-06-09: qty 2

## 2016-06-09 SURGICAL SUPPLY — 47 items
APPLICATOR COTTON TIP 6IN STRL (MISCELLANEOUS) ×4 IMPLANT
BAG URINE DRAINAGE (UROLOGICAL SUPPLIES) IMPLANT
BAG URINE LEG 19OZ MD ST LTX (BAG) IMPLANT
BLADE SURG 15 STRL LF DISP TIS (BLADE) IMPLANT
BLADE SURG 15 STRL SS (BLADE)
CANISTER SUCTION 1200CC (MISCELLANEOUS) IMPLANT
CANISTER SUCTION 2500CC (MISCELLANEOUS) IMPLANT
CATH FOLEY 2WAY SLVR  5CC 16FR (CATHETERS)
CATH FOLEY 2WAY SLVR 5CC 16FR (CATHETERS) IMPLANT
CATH ROBINSON RED A/P 16FR (CATHETERS) IMPLANT
CLOTH BEACON ORANGE TIMEOUT ST (SAFETY) ×2 IMPLANT
COVER BACK TABLE 60X90IN (DRAPES) ×2 IMPLANT
DEPRESSOR TONGUE BLADE STERILE (MISCELLANEOUS) ×2 IMPLANT
DRAPE LG THREE QUARTER DISP (DRAPES) IMPLANT
DRAPE UNDERBUTTOCKS STRL (DRAPE) ×2 IMPLANT
DRSG TELFA 3X8 NADH (GAUZE/BANDAGES/DRESSINGS) IMPLANT
ELECT BALL LEEP 3MM BLK (ELECTRODE) IMPLANT
ELECT REM PT RETURN 9FT ADLT (ELECTROSURGICAL) ×2
ELECTRODE REM PT RTRN 9FT ADLT (ELECTROSURGICAL) ×1 IMPLANT
GLOVE BIO SURGEON STRL SZ 6.5 (GLOVE) ×2 IMPLANT
GLOVE BIO SURGEON STRL SZ7.5 (GLOVE) ×4 IMPLANT
GLOVE INDICATOR 6.5 STRL GRN (GLOVE) ×2 IMPLANT
GOWN STRL REUS W/ TWL LRG LVL3 (GOWN DISPOSABLE) ×2 IMPLANT
GOWN STRL REUS W/TWL LRG LVL3 (GOWN DISPOSABLE) ×4 IMPLANT
KIT ROOM TURNOVER WOR (KITS) ×2 IMPLANT
LEGGING LITHOTOMY PAIR STRL (DRAPES) IMPLANT
MANIFOLD NEPTUNE II (INSTRUMENTS) IMPLANT
NEEDLE HYPO 25X1 1.5 SAFETY (NEEDLE) IMPLANT
NS IRRIG 500ML POUR BTL (IV SOLUTION) IMPLANT
PACK BASIN DAY SURGERY FS (CUSTOM PROCEDURE TRAY) ×2 IMPLANT
PAD OB MATERNITY 4.3X12.25 (PERSONAL CARE ITEMS) ×2 IMPLANT
PAD PREP 24X48 CUFFED NSTRL (MISCELLANEOUS) ×2 IMPLANT
PENCIL BUTTON HOLSTER BLD 10FT (ELECTRODE) IMPLANT
SCOPETTES 8  STERILE (MISCELLANEOUS) ×2
SCOPETTES 8 STERILE (MISCELLANEOUS) ×2 IMPLANT
SUT VIC AB 2-0 SH 27 (SUTURE)
SUT VIC AB 2-0 SH 27XBRD (SUTURE) IMPLANT
SUT VIC AB 3-0 PS2 18 (SUTURE)
SUT VIC AB 3-0 PS2 18XBRD (SUTURE) IMPLANT
SYR CONTROL 10ML LL (SYRINGE) IMPLANT
TOWEL OR 17X24 6PK STRL BLUE (TOWEL DISPOSABLE) ×4 IMPLANT
TRAY DSU PREP LF (CUSTOM PROCEDURE TRAY) ×2 IMPLANT
TUBE CONNECTING 12X1/4 (SUCTIONS) ×2 IMPLANT
VACUUM HOSE 7/8X10 W/ WAND (MISCELLANEOUS) IMPLANT
VACUUM HOSE/TUBING 7/8INX6FT (MISCELLANEOUS) ×2 IMPLANT
WATER STERILE IRR 500ML POUR (IV SOLUTION) ×2 IMPLANT
YANKAUER SUCT BULB TIP NO VENT (SUCTIONS) ×2 IMPLANT

## 2016-06-09 NOTE — Discharge Instructions (Addendum)
06/09/2016  Return to work: 1-2 weeks if applicable  Activity: 1. Be up and out of the bed during the day.  Take a nap if needed.  You may walk up steps but be careful and use the hand rail.  Stair climbing will tire you more than you think, you may need to stop part way and rest.   2. Do not drive if you are taking narcotic pain medicine.  3. Perform sitz baths twice daily and add tea bags to the water to help with pain symptoms.  After the sitz bath, pat your vulva dry then apply silvadene cream to the area that was lasered.  4. No sexual activity and nothing in the vagina for 1-2 weeks. Based on comfort level.  Diet: 1. Low sodium Heart Healthy Diet is recommended.  2. It is safe to use a laxative, such as Miralax or Colace, if you have difficulty moving your bowels.   Wound Care: 1. Keep clean and dry.  Shower daily.  Reasons to call the Doctor:  Fever - Oral temperature greater than 100.4 degrees Fahrenheit  Foul-smelling vaginal discharge  Difficulty urinating  Nausea and vomiting  Increased pain at the site of the incision that is unrelieved with pain medicine.  Difficulty breathing with or without chest pain  New calf pain especially if only on one side  Sudden, continuing increased vaginal bleeding with or without clots.   Contacts: For questions or concerns you should contact:  Dr. Stanford Breedlarke-Pearson at [phone] or [phone] or Warner MccreedyMelissa Cross, NP at [phone]  After Hours: call [phone] and have the GYN Oncologist paged/contacted  Post Anesthesia Home Care Instructions  Activity: Get plenty of rest for the remainder of the day. A responsible adult should stay with you for 24 hours following the procedure.  For the next 24 hours, DO NOT: -Drive a car -Advertising copywriterperate machinery -Drink alcoholic beverages -Take any medication unless instructed by your physician -Make any legal decisions or sign important papers.  Meals: Start with liquid foods such as  gelatin or soup. Progress to regular foods as tolerated. Avoid greasy, spicy, heavy foods. If nausea and/or vomiting occur, drink only clear liquids until the nausea and/or vomiting subsides. Call your physician if vomiting continues.  Special Instructions/Symptoms: Your throat may feel dry or sore from the anesthesia or the breathing tube placed in your throat during surgery. If this causes discomfort, gargle with warm salt water. The discomfort should disappear within 24 hours.  If you had a scopolamine patch placed behind your ear for the management of post- operative nausea and/or vomiting:  1. The medication in the patch is effective for 72 hours, after which it should be removed.  Wrap patch in a tissue and discard in the trash. Wash hands thoroughly with soap and water. 2. You may remove the patch earlier than 72 hours if you experience unpleasant side effects which may include dry mouth, dizziness or visual disturbances. 3. Avoid touching the patch. Wash your hands with soap and water after contact with the patch.

## 2016-06-09 NOTE — Anesthesia Postprocedure Evaluation (Signed)
Anesthesia Post Note  Patient: Katelyn Thornton  Procedure(s) Performed: Procedure(s) (LRB): CO2 LASER APPLICATION OF VULVA (N/A)  Patient location during evaluation: PACU Anesthesia Type: General Level of consciousness: awake and alert Pain management: pain level controlled Vital Signs Assessment: post-procedure vital signs reviewed and stable Respiratory status: spontaneous breathing, nonlabored ventilation, respiratory function stable and patient connected to nasal cannula oxygen Cardiovascular status: blood pressure returned to baseline and stable Postop Assessment: no signs of nausea or vomiting Anesthetic complications: no    Last Vitals:  Vitals:   06/09/16 0900 06/09/16 0915  BP: 105/60 (!) 84/54  Pulse: 79 (!) 52  Resp: 17 12  Temp:      Last Pain:  Vitals:   06/09/16 0915  TempSrc:   PainSc: Asleep                 Latifah Padin S

## 2016-06-09 NOTE — Anesthesia Preprocedure Evaluation (Signed)
Anesthesia Evaluation  Patient identified by MRN, date of birth, ID band Patient awake    Reviewed: Allergy & Precautions, NPO status , Patient's Chart, lab work & pertinent test results  Airway Mallampati: II  TM Distance: >3 FB Neck ROM: Full    Dental no notable dental hx.    Pulmonary Current Smoker,    Pulmonary exam normal breath sounds clear to auscultation       Cardiovascular hypertension, Normal cardiovascular exam Rhythm:Regular Rate:Normal     Neuro/Psych negative neurological ROS  negative psych ROS   GI/Hepatic negative GI ROS, Neg liver ROS,   Endo/Other  negative endocrine ROS  Renal/GU negative Renal ROS  negative genitourinary   Musculoskeletal negative musculoskeletal ROS (+)   Abdominal   Peds negative pediatric ROS (+)  Hematology negative hematology ROS (+)   Anesthesia Other Findings   Reproductive/Obstetrics negative OB ROS                             Anesthesia Physical Anesthesia Plan  ASA: II  Anesthesia Plan: General   Post-op Pain Management:    Induction: Intravenous  Airway Management Planned: LMA  Additional Equipment:   Intra-op Plan:   Post-operative Plan: Extubation in OR  Informed Consent: I have reviewed the patients History and Physical, chart, labs and discussed the procedure including the risks, benefits and alternatives for the proposed anesthesia with the patient or authorized representative who has indicated his/her understanding and acceptance.   Dental advisory given  Plan Discussed with: CRNA and Surgeon  Anesthesia Plan Comments:         Anesthesia Quick Evaluation

## 2016-06-09 NOTE — H&P (View-Only) (Signed)
Consult Note: Gyn-Onc   Arlys JohnKelly J Nading 40 y.o. female  Chief Complaint  Patient presents with  . 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 Left lower quadrant pain and menorrhagia.  Plan: With regard to the vulvar dysplasia recommend she have laser ablation of these isolated lesions. We will schedule that as an outpatient on October 20 at [facility]. Postoperative management with tea sitz baths and Silvadene was discussed with the patient and she understands this take several weeks to heal.  With regard to the left lower quadrant pain of 3 weeks' duration and her menorrhagia I would suggest she return to Dr. for ultrasound evaluation and other management.    HPI:The patient is seen in consultation at the request of Dr. Ernestina PennaNigel Buist regarding management of severe dysplasia. The patient is had lesions on her vulva for number of years. However she finally was evaluated by her gynecologist. Biopsies revealed this to be a severe dysplasia.  Patient has remote history of abnormal Pap smear treated with a LEEP procedure in 1995.  Obstetrical history the patient has 5 living children. She's had 3 cesarean sections. It is noted that the patient's a smoker although she is trying to reduce her smoking and is only having approximately 5 cigarettes a day.  Review of Systems:10 point review of systems is negative except as noted in interval history.   Vitals: Blood pressure 119/86, pulse 71, temperature 98.5 F (36.9 C), temperature source Oral, resp. rate 18, height 5\' 2"  (1.575 m), weight 142 lb 7 oz (64.6 kg), unknown if currently breastfeeding.  Physical Exam: General : The patient is a healthy woman in no acute distress.  HEENT: normocephalic, extraoccular movements normal; neck is supple without thyromegally  Lynphnodes: Supraclavicular and inguinal nodes not enlarged  Abdomen: Soft, non-tender, no  ascites, no organomegally, no masses, no hernias  Pelvic:  EGBUS: Normal female. On the mons there are number of hyperpigmented areas. These been previously biopsied and found to be severe dysplasia. They're not necessarily typical of severe dysplasia and certainly are not verrucous in nature. Vagina: Normal, no lesions  Urethra and Bladder: Normal, non-tender  Cervix: Normal  Uterus: anterior normal shape size and consistency  Bi-manual examination: The left adnexal region is very tender. I do not feel a mass although exam is difficult because the patient's guarding.  Rectal: normal sphincter tone, no masses, no blood  Lower extremities: No edema or varicosities. Normal range of motion      Allergies  Allergen Reactions  . Ciprofloxacin Hives and Shortness Of Breath  . Doxycycline Hives and Shortness Of Breath    Past Medical History:  Diagnosis Date  . Anxiety   . Hypertension   . Pregnant     Past Surgical History:  Procedure Laterality Date  . CESAREAN SECTION     x2  . CHOLECYSTECTOMY      Current Outpatient Prescriptions  Medication Sig Dispense Refill  . amLODipine (NORVASC) 10 MG tablet Take by mouth.    . clonazePAM (KLONOPIN) 1 MG tablet TAKE ONE TABLET BY MOUTH TWICE DAILY AS NEEDED FOR ANXIETY    . metoprolol tartrate (LOPRESSOR) 25 MG tablet TAKE ONE TABLET BY MOUTH 4 TIMES DAILY    . Cetirizine HCl (ZYRTEC ALLERGY) 10 MG CAPS Take 1 capsule (10 mg total) by mouth daily as needed. (Patient not taking: Reported on 05/19/2016) 14 capsule 0   No current facility-administered medications  for this visit.     Social History   Social History  . Marital status: Single    Spouse name: N/A  . Number of children: N/A  . Years of education: N/A   Occupational History  . Not on file.   Social History Main Topics  . Smoking status: Current Every Day Smoker    Packs/day: 0.25    Types: Cigarettes  . Smokeless tobacco: Never Used  . Alcohol use No  . Drug use:  No  . Sexual activity: Yes    Birth control/ protection: None   Other Topics Concern  . Not on file   Social History Narrative  . No narrative on file    Family History  Problem Relation Age of Onset  . Cancer Mother   . Stroke Mother   . Cancer Father   . Cancer Sister   . Cancer Maternal Grandfather       De Blanchaniel Clarke-Pearson, MD 05/19/2016, 3:08 PM

## 2016-06-09 NOTE — Anesthesia Procedure Notes (Signed)
Procedure Name: LMA Insertion Date/Time: 06/09/2016 8:02 AM Performed by: Eilene GhaziOSE, GEORGE Pre-anesthesia Checklist: Patient identified, Emergency Drugs available, Suction available and Patient being monitored Patient Re-evaluated:Patient Re-evaluated prior to inductionOxygen Delivery Method: Circle system utilized Preoxygenation: Pre-oxygenation with 100% oxygen Intubation Type: IV induction Ventilation: Mask ventilation without difficulty LMA: LMA inserted LMA Size: 4.0 Number of attempts: 1 Airway Equipment and Method: Bite block Placement Confirmation: positive ETCO2 Tube secured with: Tape Dental Injury: Teeth and Oropharynx as per pre-operative assessment

## 2016-06-09 NOTE — Interval H&P Note (Signed)
History and Physical Interval Note:  06/09/2016 7:39 AM  Katelyn Thornton  has presented today for surgery, with the diagnosis of HIGH GRADE SQUAMOUS INTRAEPITHELIAL DYSPLASIA  The various methods of treatment have been discussed with the patient and family. After consideration of risks, benefits and other options for treatment, the patient has consented to  Procedure(s): CO2 LASER APPLICATION OF VULVA (N/A) as a surgical intervention .  The patient's history has been reviewed, patient examined, no change in status, stable for surgery.  I have reviewed the patient's chart and labs.  Questions were answered to the patient's satisfaction.     De Blanchaniel Clarke-Pearson

## 2016-06-09 NOTE — Progress Notes (Signed)
Dr. Noreene LarssonJOSLIN CHECKED PATIENT IN PHASE LOW BLOOD PRESSURE.   PATIENT PRESSURE WAS STABLE AND PATIENT ABLE TO AMBULATE WELL.  DR. Noreene LarssonJoslin oked for patient to go home..Marland Kitchen

## 2016-06-09 NOTE — Transfer of Care (Signed)
Last Vitals:  Vitals:   06/09/16 0700 06/09/16 0841  BP: 104/66 (!) 119/57  Pulse:  67  Resp: (!) 24 13  Temp: 36.9 C 36.7 C    Last Pain:  Vitals:   06/09/16 0700  TempSrc: Oral      Patients Stated Pain Goal: 6 (06/09/16 0725)  Immediate Anesthesia Transfer of Care Note  Patient: Katelyn Thornton  Procedure(s) Performed: Procedure(s) (LRB): CO2 LASER APPLICATION OF VULVA (N/A)  Patient Location: PACU  Anesthesia Type: General  Level of Consciousness: awake, alert  and oriented  Airway & Oxygen Therapy: Patient Spontanous Breathing and Patient connected to nasal cannula oxygen  Post-op Assessment: Report given to PACU RN and Post -op Vital signs reviewed and stable  Post vital signs: Reviewed and stable  Complications: No apparent anesthesia complications

## 2016-06-09 NOTE — Op Note (Signed)
Arlys JohnKelly J Morea  female MEDICAL RECORD [id number] DATE OF BIRTH: 04/09/1976 PHYSICIAN: De Blanchaniel Clarke-Pearson, M.D  06/09/2016   OPERATIVE REPORT  PREOPERATIVE DIAGNOSIS:  VIN III   POSTOPERATIVE DIAGNOSIS:  SAME   PROCEDURE:   CO2 laser of vulvar lesions (extensive)    SURGEON: De Blanchaniel Clarke-Pearson, M.D   ANESTHESIA:   LMA (general) ESTIMATED BLOOD LOSS:  nil  SURGICAL FINDINGS:   Multiple hyperpigmented lesions on the mons and vulva  PROCEDURE:  After induction of general anesthesia the patient was prepped and draped with wet towels.  A surgical time out was taken.  Acetic acid was placed on the vulva and mons to highlight the lesions. Using the CO2 laser at 15 watts all lesions were ablated with a 5 mm margin to the second surgical plane. No bleeding was encountered.  Silvadene cream was applied to the lesions and the patient was awakened from anesthesia and taken to the PACU to be discharged home. Sponge count was correct   De Blanchaniel Clarke-Pearson, M.D

## 2016-06-12 ENCOUNTER — Encounter (HOSPITAL_BASED_OUTPATIENT_CLINIC_OR_DEPARTMENT_OTHER): Payer: Self-pay | Admitting: Gynecology

## 2016-06-12 ENCOUNTER — Other Ambulatory Visit: Payer: Self-pay | Admitting: Gynecologic Oncology

## 2016-06-12 ENCOUNTER — Telehealth: Payer: Self-pay

## 2016-06-12 DIAGNOSIS — G8918 Other acute postprocedural pain: Secondary | ICD-10-CM

## 2016-06-12 MED ORDER — OXYCODONE-ACETAMINOPHEN 5-325 MG PO TABS: 1.0000 | ORAL_TABLET | ORAL | 0 refills | Status: DC | PRN

## 2016-06-12 MED FILL — OXYCODONE/APAP 5/325 MG TAB: 5-325 | 3 days supply | Qty: 30 | Fill #0

## 2016-06-12 NOTE — Telephone Encounter (Signed)
Patient's call returned , patient states she is having "vulva" pain even though she is taking her "Vicodin" as directed. Patient states she is taking 1-2 tablets every six hours PRN . Patient states she has been taking sitz baths with "tea" bags , using the silvadene cream and has not had any relief . Patient denies fever or chills . Melissa Cross, APNP updated. New prescription written for Percocet 5-325 MG 1-2 tablets PO every 4 hours PRN for pain. Patient updated with changes , patient states understanding, denies further questions at this time. Patient states she will come and pick up the prescription.

## 2016-06-23 ENCOUNTER — Ambulatory Visit: Payer: Medicaid Other | Attending: Gynecology | Admitting: Gynecology

## 2016-06-23 ENCOUNTER — Encounter: Payer: Self-pay | Admitting: Gynecology

## 2016-06-23 ENCOUNTER — Ambulatory Visit: Payer: Medicaid Other | Admitting: Gynecology

## 2016-06-23 VITALS — BP 124/83 | HR 60 | Temp 98.0°F | Wt 148.6 lb

## 2016-06-23 DIAGNOSIS — F419 Anxiety disorder, unspecified: Secondary | ICD-10-CM | POA: Insufficient documentation

## 2016-06-23 DIAGNOSIS — Z823 Family history of stroke: Secondary | ICD-10-CM | POA: Diagnosis not present

## 2016-06-23 DIAGNOSIS — F17210 Nicotine dependence, cigarettes, uncomplicated: Secondary | ICD-10-CM | POA: Diagnosis not present

## 2016-06-23 DIAGNOSIS — I10 Essential (primary) hypertension: Secondary | ICD-10-CM | POA: Insufficient documentation

## 2016-06-23 DIAGNOSIS — Z881 Allergy status to other antibiotic agents status: Secondary | ICD-10-CM | POA: Diagnosis not present

## 2016-06-23 DIAGNOSIS — R87613 High grade squamous intraepithelial lesion on cytologic smear of cervix (HGSIL): Secondary | ICD-10-CM

## 2016-06-23 DIAGNOSIS — Z79899 Other long term (current) drug therapy: Secondary | ICD-10-CM | POA: Diagnosis not present

## 2016-06-23 DIAGNOSIS — Z809 Family history of malignant neoplasm, unspecified: Secondary | ICD-10-CM | POA: Insufficient documentation

## 2016-06-23 DIAGNOSIS — Z9889 Other specified postprocedural states: Secondary | ICD-10-CM | POA: Diagnosis not present

## 2016-06-23 DIAGNOSIS — D071 Carcinoma in situ of vulva: Secondary | ICD-10-CM | POA: Diagnosis present

## 2016-06-23 MED FILL — oxyCODONE HCL 5 MG TABS: 5 | 10 days supply | Qty: 30 | Fill #0

## 2016-06-23 MED FILL — LIDOCAINE 5% OINTMENT: 5 | 10 days supply | Qty: 30 | Fill #0

## 2016-06-23 NOTE — Patient Instructions (Signed)
F/u in  Apply wet tea bags to affected areas, apply cream to affected area as needed. Ibuprofen for pain.

## 2016-06-23 NOTE — Progress Notes (Signed)
Consult Note: Gyn-Onc   Katelyn Thornton 40 y.o. female  Chief Complaint  Patient presents with  . HGSIL    Post-op follow up    Assessment :High-grade vulvar dysplasia on the monsStatus post laser vaporization on 06/09/2016. Appropriate healing to date..  Plan:  The patient will continue sitz baths twice a day or 3 times a day. She'll continue to apply Silvadene and use lidocaine in areas where it is particularly uncomfortable.  The patient's given new prescriptions for Silvadene cream 1%, lidocaine ointment 5%, and oxycodone 5 mg (dispensing 30). She return to see me November 20.       HPI:The patient is seen in consultation at the request of Dr. Ernestina PennaNigel Buist regarding management of severe dysplasia. The patient is had lesions on her vulva for number of years. However she finally was evaluated by her gynecologist. Biopsies revealed this to be a severe dysplasia.  Patient has remote history of abnormal Pap smear treated with a LEEP procedure in 1995.  Obstetrical history the patient has 5 living children. She's had 3 cesarean sections. It is noted that the patient's a smoker although she is trying to reduce her smoking and is only having approximately 5 cigarettes a day.  The patient underwent laser vaporization of multiple lesions on 06/09/2016.  Review of Systems:10 point review of systems is negative except as noted in interval history.   Vitals: Blood pressure 124/83, pulse 60, temperature 98 F (36.7 C), temperature source Oral, weight 148 lb 9 oz (67.4 kg), last menstrual period 06/03/2016, unknown if currently breastfeeding.  Physical Exam: General : The patient is a healthy woman in no acute distress.  HEENT: normocephalic, extraoccular movements normal; neck is supple without thyromegally  Lynphnodes: Supraclavicular and inguinal nodes not enlarged  Abdomen: Soft, non-tender, no ascites, no organomegally, no masses, no hernias  Pelvic:  EGBUS: Normal female on the  lasered areas on the mons and vulva are healing appropriately. There is no evidence of infection. The area near her buttocks is the most painful area. This is to carefully and is no evidence of infection.     Allergies  Allergen Reactions  . Ciprofloxacin Hives and Shortness Of Breath  . Doxycycline Hives and Shortness Of Breath    Past Medical History:  Diagnosis Date  . Anxiety   . Headache    migraines  . Hypertension     Past Surgical History:  Procedure Laterality Date  . CERVICAL BIOPSY  03/2016  . CESAREAN SECTION     x2  . CHOLECYSTECTOMY    . CO2 LASER APPLICATION N/A 06/09/2016   Procedure: CO2 LASER APPLICATION OF VULVA;  Surgeon: De Blanchaniel Clarke-Pearson, MD;  [facility];  Service: Gynecology;  Laterality: N/A;  . LEEP  1995  . TUBAL LIGATION      Current Outpatient Prescriptions  Medication Sig Dispense Refill  . metoprolol tartrate (LOPRESSOR) 25 MG tablet TAKE ONE TABLET BY MOUTH 4 TIMES DAILY    . amLODipine (NORVASC) 10 MG tablet Take by mouth.    . Cetirizine HCl (ZYRTEC ALLERGY) 10 MG CAPS Take 1 capsule (10 mg total) by mouth daily as needed. (Patient not taking: Reported on 06/23/2016) 14 capsule 0  . clonazePAM (KLONOPIN) 1 MG tablet TAKE ONE TABLET BY MOUTH TWICE DAILY AS NEEDED FOR ANXIETY    . oxyCODONE-acetaminophen (PERCOCET/ROXICET) 5-325 MG tablet Take 1-2 tablets by mouth every 4 (four) hours as needed for severe pain. Do not take and drive. (Patient not taking:  Reported on 06/23/2016) 30 tablet 0  . silver sulfADIAZINE (SILVADENE) 1 % cream Apply topically.     No current facility-administered medications for this visit.     Social History   Social History  . Marital status: Single    Spouse name: N/A  . Number of children: N/A  . Years of education: N/A   Occupational History  . Not on file.   Social History Main Topics  . Smoking status: Current Every Day Smoker    Packs/day: 0.50    Types: Cigarettes  .  Smokeless tobacco: Never Used  . Alcohol use No  . Drug use: No  . Sexual activity: Yes    Birth control/ protection: None   Other Topics Concern  . Not on file   Social History Narrative  . No narrative on file    Family History  Problem Relation Age of Onset  . Cancer Mother   . Stroke Mother   . Cancer Father   . Cancer Sister   . Cancer Maternal Grandfather       De Blanchaniel Clarke-Pearson, MD 06/23/2016, 9:54 AM

## 2016-07-10 ENCOUNTER — Ambulatory Visit: Payer: Medicaid Other | Admitting: Gynecology

## 2017-02-08 ENCOUNTER — Other Ambulatory Visit: Payer: Self-pay | Admitting: Physician Assistant

## 2017-02-08 DIAGNOSIS — M542 Cervicalgia: Secondary | ICD-10-CM

## 2017-03-02 ENCOUNTER — Other Ambulatory Visit: Payer: Medicaid Other

## 2017-03-22 ENCOUNTER — Inpatient Hospital Stay: Admission: RE | Admit: 2017-03-22 | Payer: Medicaid Other | Source: Ambulatory Visit

## 2017-03-22 ENCOUNTER — Other Ambulatory Visit: Payer: Medicaid Other

## 2017-03-30 ENCOUNTER — Other Ambulatory Visit: Payer: Medicaid Other

## 2017-04-08 ENCOUNTER — Other Ambulatory Visit: Payer: Medicaid Other

## 2017-04-29 ENCOUNTER — Inpatient Hospital Stay: Admission: RE | Admit: 2017-04-29 | Payer: Self-pay | Source: Ambulatory Visit

## 2017-06-19 ENCOUNTER — Other Ambulatory Visit: Payer: Self-pay | Admitting: Physician Assistant

## 2017-06-19 DIAGNOSIS — M542 Cervicalgia: Secondary | ICD-10-CM

## 2017-06-30 ENCOUNTER — Ambulatory Visit
Admission: RE | Admit: 2017-06-30 | Discharge: 2017-06-30 | Disposition: A | Discharge status: Home / Self Care | Payer: Medicaid Other | Source: Ambulatory Visit | Attending: Physician Assistant | Admitting: Physician Assistant

## 2017-06-30 DIAGNOSIS — M542 Cervicalgia: Secondary | ICD-10-CM

## 2017-08-30 ENCOUNTER — Other Ambulatory Visit: Payer: Self-pay | Admitting: Neurological Surgery

## 2017-09-10 NOTE — Pre-Procedure Instructions (Signed)
Katelyn Thornton  09/10/2017      [facility] [address] - [place], [place] - [address] [place] [id number] Phone: [phone] Fax: [phone]    Your procedure is scheduled on 09/17/2017.  Report to [facility] Admitting at 0900 A.M.  Call this number if you have problems the morning of surgery:  [phone]   Remember:   Continue all medications as directed by your physician except follow these medication instructions before surgery below   Do not eat food or drink liquids after midnight.   Take these medicines the morning of surgery with A SIP OF WATER: Clonazepam (Klonopin) - if needed Levothyroxizine (Synthroid) Metoprolol succinate (Toprol XL)  7 days prior to surgery STOP taking any Aspirin (unless otherwise instructed by your surgeon), Aleve, Naproxen, Ibuprofen, Motrin, Advil, Goody's, BC's, all herbal medications, fish oil, and all vitamins    Do not wear jewelry, make-up or nail polish.  Do not wear lotions, powders, or perfumes, or deodorant.  Do not shave 48 hours prior to surgery.    Do not bring valuables to the hospital.  [facility] is not responsible for any belongings or valuables.  Hearing aids, eyeglasses, contacts, dentures or bridgework may not be worn into surgery.  Leave your suitcase in the car.  After surgery it may be brought to your room.  For patients admitted to the hospital, discharge time will be determined by your treatment team.  Patients discharged the day of surgery will not be allowed to drive home.   Name and phone number of your driver:    Special instructions:   - Preparing For Surgery  Before surgery, you can play an important role. Because skin is not sterile, your skin needs to be as free of germs as possible. You can reduce the number of germs on your skin by washing with CHG (chlorahexidine gluconate) Soap before surgery.  CHG is an antiseptic cleaner which kills germs and  bonds with the skin to continue killing germs even after washing.  Please do not use if you have an allergy to CHG or antibacterial soaps. If your skin becomes reddened/irritated stop using the CHG.  Do not shave (including legs and underarms) for at least 48 hours prior to first CHG shower. It is OK to shave your face.  Please follow these instructions carefully.   1. Shower the NIGHT BEFORE SURGERY and the MORNING OF SURGERY with CHG.   2. If you chose to wash your hair, wash your hair first as usual with your normal shampoo.  3. After you shampoo, rinse your hair and body thoroughly to remove the shampoo.  4. Use CHG as you would any other liquid soap. You can apply CHG directly to the skin and wash gently with a scrungie or a clean washcloth.   5. Apply the CHG Soap to your body ONLY FROM THE NECK DOWN.  Do not use on open wounds or open sores. Avoid contact with your eyes, ears, mouth and genitals (private parts). Wash Face and genitals (private parts)  with your normal soap.  6. Wash thoroughly, paying special attention to the area where your surgery will be performed.  7. Thoroughly rinse your body with warm water from the neck down.  8. DO NOT shower/wash with your normal soap after using and rinsing off the CHG Soap.  9. Pat yourself dry with a CLEAN TOWEL.  10. Wear CLEAN PAJAMAS to bed the night  before surgery, wear comfortable clothes the morning of surgery  11. Place CLEAN SHEETS on your bed the night of your first shower and DO NOT SLEEP WITH PETS.    Day of Surgery: Shower as stated above. Do not apply any deodorants/lotions.  Please wear clean clothes to the hospital/surgery center.      Please read over the following fact sheets that you were given.

## 2017-09-11 ENCOUNTER — Other Ambulatory Visit: Payer: Self-pay

## 2017-09-11 ENCOUNTER — Encounter (HOSPITAL_COMMUNITY): Payer: Self-pay

## 2017-09-11 ENCOUNTER — Encounter (HOSPITAL_COMMUNITY)
Admission: RE | Admit: 2017-09-11 | Discharge: 2017-09-11 | Disposition: A | Discharge status: Home / Self Care | Payer: Medicaid Other | Source: Ambulatory Visit | Attending: Neurological Surgery | Admitting: Neurological Surgery

## 2017-09-11 DIAGNOSIS — I10 Essential (primary) hypertension: Secondary | ICD-10-CM | POA: Insufficient documentation

## 2017-09-11 DIAGNOSIS — Z01818 Encounter for other preprocedural examination: Secondary | ICD-10-CM | POA: Diagnosis present

## 2017-09-11 DIAGNOSIS — R001 Bradycardia, unspecified: Secondary | ICD-10-CM | POA: Insufficient documentation

## 2017-09-11 HISTORY — DX: Other complications of anesthesia, initial encounter: T88.59XA

## 2017-09-11 HISTORY — DX: Adverse effect of unspecified anesthetic, initial encounter: T41.45XA

## 2017-09-11 LAB — BASIC METABOLIC PANEL
ANION GAP: 11 (ref 5–15)
BUN: 9 mg/dL (ref 6–20)
CALCIUM: 9.2 mg/dL (ref 8.9–10.3)
CO2: 22 mmol/L (ref 22–32)
Chloride: 106 mmol/L (ref 101–111)
Creatinine, Ser: 0.89 mg/dL (ref 0.44–1.00)
Glucose, Bld: 96 mg/dL (ref 65–99)
Potassium: 4.7 mmol/L (ref 3.5–5.1)
Sodium: 139 mmol/L (ref 135–145)

## 2017-09-11 LAB — CBC
HCT: 43.3 % (ref 36.0–46.0)
HEMOGLOBIN: 14.4 g/dL (ref 12.0–15.0)
MCH: 31.2 pg (ref 26.0–34.0)
MCHC: 33.3 g/dL (ref 30.0–36.0)
MCV: 93.9 fL (ref 78.0–100.0)
Platelets: 327 10*3/uL (ref 150–400)
RBC: 4.61 MIL/uL (ref 3.87–5.11)
RDW: 13.5 % (ref 11.5–15.5)
WBC: 13.5 10*3/uL — ABNORMAL HIGH (ref 4.0–10.5)

## 2017-09-11 LAB — ABO/RH: ABO/RH(D): A POS

## 2017-09-11 LAB — SURGICAL PCR SCREEN
MRSA, PCR: NEGATIVE
STAPHYLOCOCCUS AUREUS: NEGATIVE

## 2017-09-11 LAB — TYPE AND SCREEN
ABO/RH(D): A POS
Antibody Screen: NEGATIVE

## 2017-09-11 LAB — POCT PREGNANCY, URINE: PREG TEST UR: NEGATIVE

## 2017-09-11 NOTE — Progress Notes (Signed)
PCP - Dr. Lindwood CokeMyland Cardiologist - patient denies  Chest x-ray - n/a EKG - 09/11/2017 Stress Test - patient denies ECHO - patient denies Cardiac Cath - patient denies  Sleep Study - patient denies   Patient denies shortness of breath, fever, cough and chest pain at PAT appointment   Patient verbalized understanding of instructions that were given to them at the PAT appointment. Patient was also instructed that they will need to review over the PAT instructions again at home before surgery.

## 2017-09-14 ENCOUNTER — Encounter (HOSPITAL_COMMUNITY): Payer: Self-pay | Admitting: Anesthesiology

## 2017-09-17 ENCOUNTER — Ambulatory Visit (HOSPITAL_COMMUNITY)
Admission: RE | Disposition: A | Discharge status: Home / Self Care | Payer: Self-pay | Source: Ambulatory Visit | Attending: Neurological Surgery

## 2017-09-17 ENCOUNTER — Ambulatory Visit (HOSPITAL_COMMUNITY): Payer: Medicaid Other | Admitting: Anesthesiology

## 2017-09-17 ENCOUNTER — Encounter (HOSPITAL_COMMUNITY): Payer: Self-pay | Admitting: *Deleted

## 2017-09-17 ENCOUNTER — Ambulatory Visit (HOSPITAL_COMMUNITY): Payer: Medicaid Other

## 2017-09-17 ENCOUNTER — Ambulatory Visit (HOSPITAL_COMMUNITY)
Admission: RE | Admit: 2017-09-17 | Discharge: 2017-09-17 | Disposition: A | Discharge status: Home / Self Care | Payer: Medicaid Other | Source: Ambulatory Visit | Attending: Neurological Surgery | Admitting: Neurological Surgery

## 2017-09-17 DIAGNOSIS — Z881 Allergy status to other antibiotic agents status: Secondary | ICD-10-CM | POA: Diagnosis not present

## 2017-09-17 DIAGNOSIS — M4802 Spinal stenosis, cervical region: Secondary | ICD-10-CM | POA: Diagnosis not present

## 2017-09-17 DIAGNOSIS — G8918 Other acute postprocedural pain: Secondary | ICD-10-CM

## 2017-09-17 DIAGNOSIS — M50123 Cervical disc disorder at C6-C7 level with radiculopathy: Secondary | ICD-10-CM | POA: Insufficient documentation

## 2017-09-17 DIAGNOSIS — M4722 Other spondylosis with radiculopathy, cervical region: Secondary | ICD-10-CM | POA: Diagnosis not present

## 2017-09-17 DIAGNOSIS — Z79899 Other long term (current) drug therapy: Secondary | ICD-10-CM | POA: Insufficient documentation

## 2017-09-17 DIAGNOSIS — M5412 Radiculopathy, cervical region: Secondary | ICD-10-CM | POA: Diagnosis present

## 2017-09-17 DIAGNOSIS — F419 Anxiety disorder, unspecified: Secondary | ICD-10-CM | POA: Insufficient documentation

## 2017-09-17 DIAGNOSIS — F17210 Nicotine dependence, cigarettes, uncomplicated: Secondary | ICD-10-CM | POA: Insufficient documentation

## 2017-09-17 DIAGNOSIS — I10 Essential (primary) hypertension: Secondary | ICD-10-CM | POA: Insufficient documentation

## 2017-09-17 DIAGNOSIS — Z419 Encounter for procedure for purposes other than remedying health state, unspecified: Secondary | ICD-10-CM

## 2017-09-17 HISTORY — PX: ANTERIOR CERVICAL DECOMP/DISCECTOMY FUSION: SHX1161

## 2017-09-17 SURGERY — ANTERIOR CERVICAL DECOMPRESSION/DISCECTOMY FUSION 1 LEVEL
Anesthesia: General

## 2017-09-17 MED ORDER — PROPOFOL 10 MG/ML IV BOLUS
INTRAVENOUS | Status: AC
  Filled 2017-09-17: qty 40

## 2017-09-17 MED ORDER — THROMBIN 5000 UNITS EX SOLR
CUTANEOUS | Status: AC
  Filled 2017-09-17: qty 5000

## 2017-09-17 MED ORDER — MENTHOL 3 MG MT LOZG: 1.0000 | LOZENGE | OROMUCOSAL | Status: DC | PRN

## 2017-09-17 MED ORDER — BUPIVACAINE-EPINEPHRINE (PF) 0.5% -1:200000 IJ SOLN
INTRAMUSCULAR | Status: AC
  Filled 2017-09-17: qty 30

## 2017-09-17 MED ORDER — LACTATED RINGERS IV SOLN
INTRAVENOUS | Status: DC
  Administered 2017-09-17: 08:00:00 via INTRAVENOUS

## 2017-09-17 MED ORDER — ACETAMINOPHEN 650 MG RE SUPP: 650.0000 mg | RECTAL | Status: DC | PRN

## 2017-09-17 MED ORDER — METOPROLOL SUCCINATE ER 100 MG PO TB24: 100.0000 mg | ORAL_TABLET | Freq: Every day | ORAL | Status: DC

## 2017-09-17 MED ORDER — SODIUM CHLORIDE 0.9 % IV SOLN
INTRAVENOUS | Status: DC
  Administered 2017-09-17: 15:00:00 via INTRAVENOUS

## 2017-09-17 MED ORDER — SODIUM CHLORIDE 0.9 % IV SOLN: 250.0000 mL | INTRAVENOUS | Status: DC

## 2017-09-17 MED ORDER — FLEET ENEMA 7-19 GM/118ML RE ENEM: 1.0000 | ENEMA | Freq: Once | RECTAL | Status: DC | PRN

## 2017-09-17 MED ORDER — FENTANYL CITRATE (PF) 100 MCG/2ML IJ SOLN
INTRAMUSCULAR | Status: DC | PRN
  Administered 2017-09-17: 100 ug via INTRAVENOUS
  Administered 2017-09-17: 150 ug via INTRAVENOUS

## 2017-09-17 MED ORDER — LIDOCAINE HCL (CARDIAC) 20 MG/ML IV SOLN
INTRAVENOUS | Status: DC | PRN
  Administered 2017-09-17: 140 mg via INTRAVENOUS

## 2017-09-17 MED ORDER — DEXAMETHASONE SODIUM PHOSPHATE 4 MG/ML IJ SOLN
2.0000 mg | Freq: Four times a day (QID) | INTRAMUSCULAR | Status: DC
  Filled 2017-09-17: qty 1

## 2017-09-17 MED ORDER — OXYCODONE HCL 5 MG PO TABS
10.0000 mg | ORAL_TABLET | ORAL | Status: DC | PRN
  Administered 2017-09-17 (×2): 10 mg via ORAL
  Filled 2017-09-17 (×2): qty 2

## 2017-09-17 MED ORDER — ACETAMINOPHEN 500 MG PO TABS
1000.0000 mg | ORAL_TABLET | Freq: Four times a day (QID) | ORAL | Status: DC
  Administered 2017-09-17: 1000 mg via ORAL
  Filled 2017-09-17: qty 2

## 2017-09-17 MED ORDER — OXYCODONE-ACETAMINOPHEN 7.5-325 MG PO TABS: 1.0000 | ORAL_TABLET | ORAL | 0 refills | Status: AC | PRN

## 2017-09-17 MED ORDER — PHENOL 1.4 % MT LIQD
1.0000 | OROMUCOSAL | Status: DC | PRN
  Administered 2017-09-17: 1 via OROMUCOSAL
  Filled 2017-09-17: qty 177

## 2017-09-17 MED ORDER — SENNA 8.6 MG PO TABS: 1.0000 | ORAL_TABLET | Freq: Two times a day (BID) | ORAL | Status: DC

## 2017-09-17 MED ORDER — CHLORHEXIDINE GLUCONATE CLOTH 2 % EX PADS: 6.0000 | MEDICATED_PAD | Freq: Once | CUTANEOUS | Status: DC

## 2017-09-17 MED ORDER — MIDAZOLAM HCL 5 MG/5ML IJ SOLN
INTRAMUSCULAR | Status: DC | PRN
  Administered 2017-09-17: 2 mg via INTRAVENOUS

## 2017-09-17 MED ORDER — DIPHENHYDRAMINE HCL 50 MG/ML IJ SOLN
INTRAMUSCULAR | Status: AC
  Filled 2017-09-17: qty 1

## 2017-09-17 MED ORDER — DIAZEPAM 5 MG PO TABS: 5.0000 mg | ORAL_TABLET | Freq: Four times a day (QID) | ORAL | Status: DC | PRN

## 2017-09-17 MED ORDER — LIDOCAINE-EPINEPHRINE 2 %-1:100000 IJ SOLN
INTRAMUSCULAR | Status: AC
  Filled 2017-09-17: qty 1

## 2017-09-17 MED ORDER — HYDROMORPHONE HCL 1 MG/ML IJ SOLN: 0.2500 mg | INTRAMUSCULAR | Status: DC | PRN

## 2017-09-17 MED ORDER — BISACODYL 10 MG RE SUPP: 10.0000 mg | Freq: Every day | RECTAL | Status: DC | PRN

## 2017-09-17 MED ORDER — GABAPENTIN 300 MG PO CAPS
300.0000 mg | ORAL_CAPSULE | Freq: Three times a day (TID) | ORAL | Status: DC
  Administered 2017-09-17: 300 mg via ORAL
  Filled 2017-09-17: qty 1

## 2017-09-17 MED ORDER — PANTOPRAZOLE SODIUM 40 MG IV SOLR: 40.0000 mg | Freq: Every day | INTRAVENOUS | Status: DC

## 2017-09-17 MED ORDER — METHOCARBAMOL 750 MG PO TABS: 750.0000 mg | ORAL_TABLET | Freq: Four times a day (QID) | ORAL | 2 refills | Status: DC

## 2017-09-17 MED ORDER — DEXAMETHASONE SODIUM PHOSPHATE 10 MG/ML IJ SOLN
INTRAMUSCULAR | Status: DC | PRN
  Administered 2017-09-17: 10 mg via INTRAVENOUS

## 2017-09-17 MED ORDER — ONDANSETRON HCL 4 MG/2ML IJ SOLN
INTRAMUSCULAR | Status: DC | PRN
  Administered 2017-09-17: 4 mg via INTRAVENOUS

## 2017-09-17 MED ORDER — ROCURONIUM BROMIDE 10 MG/ML (PF) SYRINGE
PREFILLED_SYRINGE | INTRAVENOUS | Status: AC
  Filled 2017-09-17: qty 5

## 2017-09-17 MED ORDER — ZOLPIDEM TARTRATE 5 MG PO TABS: 5.0000 mg | ORAL_TABLET | Freq: Every evening | ORAL | Status: DC | PRN

## 2017-09-17 MED ORDER — ONDANSETRON HCL 4 MG PO TABS: 4.0000 mg | ORAL_TABLET | Freq: Four times a day (QID) | ORAL | Status: DC | PRN

## 2017-09-17 MED ORDER — PROMETHAZINE HCL 25 MG/ML IJ SOLN
6.2500 mg | Freq: Once | INTRAMUSCULAR | Status: AC
  Administered 2017-09-17: 6.25 mg via INTRAVENOUS

## 2017-09-17 MED ORDER — ROCURONIUM BROMIDE 100 MG/10ML IV SOLN
INTRAVENOUS | Status: DC | PRN
  Administered 2017-09-17: 10 mg via INTRAVENOUS
  Administered 2017-09-17: 20 mg via INTRAVENOUS
  Administered 2017-09-17: 50 mg via INTRAVENOUS

## 2017-09-17 MED ORDER — CEFAZOLIN SODIUM-DEXTROSE 1-4 GM/50ML-% IV SOLN
1.0000 g | Freq: Three times a day (TID) | INTRAVENOUS | Status: DC
  Administered 2017-09-17: 1 g via INTRAVENOUS
  Filled 2017-09-17: qty 50

## 2017-09-17 MED ORDER — CEFAZOLIN SODIUM-DEXTROSE 2-4 GM/100ML-% IV SOLN
2.0000 g | INTRAVENOUS | Status: AC
  Administered 2017-09-17: 2 g via INTRAVENOUS
  Filled 2017-09-17: qty 100

## 2017-09-17 MED ORDER — LIDOCAINE 2% (20 MG/ML) 5 ML SYRINGE
INTRAMUSCULAR | Status: AC
  Filled 2017-09-17: qty 5

## 2017-09-17 MED ORDER — CLONAZEPAM 1 MG PO TABS: 1.0000 mg | ORAL_TABLET | Freq: Two times a day (BID) | ORAL | Status: DC

## 2017-09-17 MED ORDER — HYDROMORPHONE HCL 1 MG/ML IJ SOLN
INTRAMUSCULAR | Status: AC
  Administered 2017-09-17: 0.5 mg via INTRAVENOUS
  Filled 2017-09-17: qty 1

## 2017-09-17 MED ORDER — LEVOTHYROXINE SODIUM 100 MCG PO TABS: 100.0000 ug | ORAL_TABLET | Freq: Every day | ORAL | Status: DC

## 2017-09-17 MED ORDER — BUPIVACAINE-EPINEPHRINE (PF) 0.5% -1:200000 IJ SOLN
INTRAMUSCULAR | Status: DC | PRN
  Administered 2017-09-17: 7.5 mL

## 2017-09-17 MED ORDER — FENTANYL CITRATE (PF) 250 MCG/5ML IJ SOLN
INTRAMUSCULAR | Status: AC
  Filled 2017-09-17: qty 5

## 2017-09-17 MED ORDER — PROMETHAZINE HCL 25 MG/ML IJ SOLN
INTRAMUSCULAR | Status: AC
  Filled 2017-09-17: qty 1

## 2017-09-17 MED ORDER — SODIUM CHLORIDE 0.9% FLUSH: 3.0000 mL | Freq: Two times a day (BID) | INTRAVENOUS | Status: DC

## 2017-09-17 MED ORDER — ONDANSETRON HCL 4 MG/2ML IJ SOLN: 4.0000 mg | Freq: Four times a day (QID) | INTRAMUSCULAR | Status: DC | PRN

## 2017-09-17 MED ORDER — DEXAMETHASONE SODIUM PHOSPHATE 4 MG/ML IJ SOLN
2.0000 mg | Freq: Three times a day (TID) | INTRAMUSCULAR | Status: DC
  Administered 2017-09-17: 2 mg via INTRAVENOUS

## 2017-09-17 MED ORDER — ACETAMINOPHEN 325 MG PO TABS: 650.0000 mg | ORAL_TABLET | ORAL | Status: DC | PRN

## 2017-09-17 MED ORDER — SUGAMMADEX SODIUM 200 MG/2ML IV SOLN
INTRAVENOUS | Status: DC | PRN
  Administered 2017-09-17: 148.8 mg via INTRAVENOUS

## 2017-09-17 MED ORDER — SODIUM CHLORIDE 0.9 % IR SOLN
Status: DC | PRN
  Administered 2017-09-17: 11:00:00

## 2017-09-17 MED ORDER — LACTATED RINGERS IV SOLN
INTRAVENOUS | Status: DC | PRN
  Administered 2017-09-17 (×2): via INTRAVENOUS

## 2017-09-17 MED ORDER — METHOCARBAMOL 750 MG PO TABS
750.0000 mg | ORAL_TABLET | Freq: Four times a day (QID) | ORAL | Status: DC
  Administered 2017-09-17: 750 mg via ORAL
  Filled 2017-09-17: qty 1

## 2017-09-17 MED ORDER — DEXAMETHASONE 4 MG PO TABS
4.0000 mg | ORAL_TABLET | Freq: Four times a day (QID) | ORAL | Status: DC
  Administered 2017-09-17: 4 mg via ORAL
  Filled 2017-09-17: qty 1

## 2017-09-17 MED ORDER — OXYCODONE HCL 5 MG PO TABS: 5.0000 mg | ORAL_TABLET | ORAL | Status: DC | PRN

## 2017-09-17 MED ORDER — HYDROMORPHONE HCL 1 MG/ML IJ SOLN
0.2500 mg | INTRAMUSCULAR | Status: DC | PRN
  Administered 2017-09-17 (×2): 0.5 mg via INTRAVENOUS

## 2017-09-17 MED ORDER — MIDAZOLAM HCL 2 MG/2ML IJ SOLN
INTRAMUSCULAR | Status: AC
  Filled 2017-09-17: qty 2

## 2017-09-17 MED ORDER — 0.9 % SODIUM CHLORIDE (POUR BTL) OPTIME
TOPICAL | Status: DC | PRN
  Administered 2017-09-17: 1000 mL

## 2017-09-17 MED ORDER — SODIUM CHLORIDE 0.9% FLUSH: 3.0000 mL | INTRAVENOUS | Status: DC | PRN

## 2017-09-17 MED ORDER — SUGAMMADEX SODIUM 200 MG/2ML IV SOLN
INTRAVENOUS | Status: AC
  Filled 2017-09-17: qty 2

## 2017-09-17 MED ORDER — DOCUSATE SODIUM 100 MG PO CAPS
100.0000 mg | ORAL_CAPSULE | Freq: Two times a day (BID) | ORAL | Status: DC
  Administered 2017-09-17: 100 mg via ORAL
  Filled 2017-09-17: qty 1

## 2017-09-17 MED ORDER — CELECOXIB 200 MG PO CAPS
200.0000 mg | ORAL_CAPSULE | Freq: Two times a day (BID) | ORAL | Status: DC
  Administered 2017-09-17: 200 mg via ORAL
  Filled 2017-09-17: qty 1

## 2017-09-17 MED ORDER — ONDANSETRON HCL 4 MG/2ML IJ SOLN
INTRAMUSCULAR | Status: AC
  Filled 2017-09-17: qty 2

## 2017-09-17 MED ORDER — LIDOCAINE-EPINEPHRINE 2 %-1:100000 IJ SOLN
INTRAMUSCULAR | Status: DC | PRN
  Administered 2017-09-17: 7.5 mL via INTRADERMAL

## 2017-09-17 MED ORDER — GABAPENTIN 300 MG PO CAPS: 300.0000 mg | ORAL_CAPSULE | Freq: Three times a day (TID) | ORAL | 2 refills | Status: AC

## 2017-09-17 MED ORDER — GELATIN ABSORBABLE MT POWD
OROMUCOSAL | Status: DC | PRN
  Administered 2017-09-17: 11:00:00 via TOPICAL

## 2017-09-17 SURGICAL SUPPLY — 68 items
BIT DRILL 2.3 12 FIXED (INSTRUMENTS) ×1 IMPLANT
BUR MATCHSTICK NEURO 3.0 LAGG (BURR) ×2 IMPLANT
CAGE CERVICAL 8 (Cage) ×1 IMPLANT
CANISTER SUCT 3000ML PPV (MISCELLANEOUS) ×2 IMPLANT
CARTRIDGE OIL MAESTRO DRILL (MISCELLANEOUS) ×1 IMPLANT
CHLORAPREP W/TINT 26ML (MISCELLANEOUS) ×2 IMPLANT
DECANTER SPIKE VIAL GLASS SM (MISCELLANEOUS) ×2 IMPLANT
DERMABOND ADVANCED (GAUZE/BANDAGES/DRESSINGS) ×1
DERMABOND ADVANCED .7 DNX12 (GAUZE/BANDAGES/DRESSINGS) ×1 IMPLANT
DIFFUSER DRILL AIR PNEUMATIC (MISCELLANEOUS) ×2 IMPLANT
DRAIN SNY WOU 7FLT (WOUND CARE) ×2 IMPLANT
DRAPE C-ARM 42X72 X-RAY (DRAPES) ×4 IMPLANT
DRAPE HALF SHEET 40X57 (DRAPES) ×2 IMPLANT
DRAPE LAPAROTOMY 100X72 PEDS (DRAPES) ×2 IMPLANT
DRAPE MICROSCOPE LEICA (MISCELLANEOUS) ×2 IMPLANT
DRAPE POUCH INSTRU U-SHP 10X18 (DRAPES) ×2 IMPLANT
DRAPE SHEET LG 3/4 BI-LAMINATE (DRAPES) IMPLANT
DRILL 12MM (INSTRUMENTS) ×2
DRSG OPSITE POSTOP 3X4 (GAUZE/BANDAGES/DRESSINGS) ×2 IMPLANT
ELECT COATED BLADE 2.86 ST (ELECTRODE) ×2 IMPLANT
ELECT REM PT RETURN 9FT ADLT (ELECTROSURGICAL) ×2
ELECTRODE REM PT RTRN 9FT ADLT (ELECTROSURGICAL) ×1 IMPLANT
EVACUATOR 1/8 PVC DRAIN (DRAIN) IMPLANT
EVACUATOR SILICONE 100CC (DRAIN) ×2 IMPLANT
GAUZE SPONGE 4X4 12PLY STRL (GAUZE/BANDAGES/DRESSINGS) IMPLANT
GAUZE SPONGE 4X4 16PLY XRAY LF (GAUZE/BANDAGES/DRESSINGS) IMPLANT
GLOVE BIO SURGEON STRL SZ7 (GLOVE) IMPLANT
GLOVE BIOGEL PI IND STRL 7.0 (GLOVE) IMPLANT
GLOVE BIOGEL PI IND STRL 7.5 (GLOVE) ×1 IMPLANT
GLOVE BIOGEL PI INDICATOR 7.0 (GLOVE)
GLOVE BIOGEL PI INDICATOR 7.5 (GLOVE) ×1
GOWN STRL REUS W/ TWL LRG LVL3 (GOWN DISPOSABLE) ×1 IMPLANT
GOWN STRL REUS W/ TWL XL LVL3 (GOWN DISPOSABLE) ×1 IMPLANT
GOWN STRL REUS W/TWL LRG LVL3 (GOWN DISPOSABLE) ×1
GOWN STRL REUS W/TWL XL LVL3 (GOWN DISPOSABLE) ×1
HEMOSTAT POWDER KIT SURGIFOAM (HEMOSTASIS) ×2 IMPLANT
HEMOSTAT POWDER SURGIFOAM 1G (HEMOSTASIS) IMPLANT
KIT BASIN OR (CUSTOM PROCEDURE TRAY) ×2 IMPLANT
KIT ROOM TURNOVER OR (KITS) ×2 IMPLANT
NEEDLE HYPO 21X1.5 SAFETY (NEEDLE) ×2 IMPLANT
NEEDLE SPNL 18GX3.5 QUINCKE PK (NEEDLE) ×2 IMPLANT
NS IRRIG 1000ML POUR BTL (IV SOLUTION) ×2 IMPLANT
OIL CARTRIDGE MAESTRO DRILL (MISCELLANEOUS) ×2
PACK LAMINECTOMY NEURO (CUSTOM PROCEDURE TRAY) ×2 IMPLANT
PAD ARMBOARD 7.5X6 YLW CONV (MISCELLANEOUS) ×6 IMPLANT
PATTIES SURGICAL .5X1.5 (GAUZE/BANDAGES/DRESSINGS) ×2 IMPLANT
PIN DISTRACTION 14MM (PIN) IMPLANT
PLATE 16MM (Plate) ×2 IMPLANT
PUTTY BONE 2.5CC ×2 IMPLANT
RUBBERBAND STERILE (MISCELLANEOUS) ×4 IMPLANT
SCREW 14MM (Screw) ×4 IMPLANT
SCREW BONE CANN 14 SS SD STR (Screw) ×4 IMPLANT
SPACER SPNL 7D LRG 16X14X8XNS (Cage) ×1 IMPLANT
SPCR SPNL 7D LRG 16X14X8XNS (Cage) ×1 IMPLANT
SPONGE INTESTINAL PEANUT (DISPOSABLE) ×2 IMPLANT
SPONGE SURGIFOAM ABS GEL SZ50 (HEMOSTASIS) IMPLANT
STAPLER VISISTAT 35W (STAPLE) ×2 IMPLANT
STEM CELL AMNIOTIC NUCEL SM (Tissue) ×2 IMPLANT
STOCKINETTE 6  STRL (DRAPES) ×1
STOCKINETTE 6 STRL (DRAPES) ×1 IMPLANT
SUT STRATAFIX MNCRL+ 3-0 PS-2 (SUTURE) ×1
SUT STRATAFIX MONOCRYL 3-0 (SUTURE) ×1
SUT VIC AB 3-0 SH 8-18 (SUTURE) ×2 IMPLANT
SUTURE STRATFX MNCRL+ 3-0 PS-2 (SUTURE) ×1 IMPLANT
SYR 30ML LL (SYRINGE) ×2 IMPLANT
TOWEL GREEN STERILE (TOWEL DISPOSABLE) ×2 IMPLANT
TOWEL GREEN STERILE FF (TOWEL DISPOSABLE) ×4 IMPLANT
WATER STERILE IRR 1000ML POUR (IV SOLUTION) ×2 IMPLANT

## 2017-09-17 NOTE — Transfer of Care (Signed)
Immediate Anesthesia Transfer of Care Note  Patient: Katelyn Thornton  Procedure(s) Performed: Cervical Five- Six Anterior cervical discectomy with fusion and plate fixation (N/A )  Patient Location: PACU  Anesthesia Type:General  Level of Consciousness: awake, alert  and oriented  Airway & Oxygen Therapy: Patient Spontanous Breathing and Patient connected to nasal cannula oxygen  Post-op Assessment: Report given to RN and Post -op Vital signs reviewed and stable  Post vital signs: Reviewed and stable  Last Vitals:  Vitals:   09/17/17 0806  BP: 136/77  Pulse: 65  Resp: 20  Temp: 36.6 C  SpO2: 100%    Last Pain:  Vitals:   09/17/17 1218  TempSrc:   PainSc: 6       Patients Stated Pain Goal: 3 (09/17/17 0815)  Complications: No apparent anesthesia complications

## 2017-09-17 NOTE — Discharge Summary (Signed)
Date of Admission: 09/17/2017  Date of Discharge: 09/17/17  PRE-OPERATIVE DIAGNOSIS:  Cervical spondylosis with radiculopathy  POST-OPERATIVE DIAGNOSIS:  Same  PROCEDURE:  C5-6 anterior discectomy with fusion and plate fixation; use of PEEK spacer  Attending: Ditty, Loura HaltBenjamin Jared, MD  Hospital Course:  The patient was admitted for the above listed operation and had an uncomplicated post-operative course.  She was discharged in stable condition.  Follow up: 3 weeks  Allergies as of 09/17/2017      Reactions   Ciprofloxacin Hives, Shortness Of Breath   Doxycycline Hives, Shortness Of Breath      Medication List    STOP taking these medications   oxyCODONE-acetaminophen 5-325 MG tablet Commonly known as:  PERCOCET/ROXICET Replaced by:  oxyCODONE-acetaminophen 7.5-325 MG tablet     TAKE these medications   Cetirizine HCl 10 MG Caps Commonly known as:  ZYRTEC ALLERGY Take 1 capsule (10 mg total) by mouth daily as needed.   clonazePAM 1 MG tablet Commonly known as:  KLONOPIN TAKE 1 MG BY MOUTH TWICE DAILY AS NEEDED FOR ANXIETY   gabapentin 300 MG capsule Commonly known as:  NEURONTIN Take 1 capsule (300 mg total) by mouth 3 (three) times daily.   GOODY HEADACHE PO Take 1 packet by mouth daily as needed (for pain or headache).   levothyroxine 125 MCG tablet Commonly known as:  SYNTHROID, LEVOTHROID Take 100 mcg by mouth daily before breakfast.   methocarbamol 750 MG tablet Commonly known as:  ROBAXIN Take 1 tablet (750 mg total) by mouth 4 (four) times daily.   metoprolol succinate 100 MG 24 hr tablet Commonly known as:  TOPROL-XL Take 100 mg by mouth daily. Take with or immediately following a meal.   oxyCODONE-acetaminophen 7.5-325 MG tablet Commonly known as:  PERCOCET Take 1-2 tablets by mouth every 4 (four) hours as needed for severe pain. Replaces:  oxyCODONE-acetaminophen 5-325 MG tablet

## 2017-09-17 NOTE — Progress Notes (Signed)
Patient alert and oriented, mae's well, voiding adequate amount of urine, swallowing without difficulty, no c/o pain at time of discharge. Patient discharged home with family. Script and discharged instructions given to patient. Patient and family stated understanding of instructions given. Patient has an appointment with Dr. Ditty  

## 2017-09-17 NOTE — H&P (Signed)
CC:  No chief complaint on file. Neck and right arm pain  HPI: Katelyn Thornton is a 42 y.o. female with neck and right arm pain due to a right sided C5-6 disc bulge with foraminal stenosis.  She denies any changes since I saw her in clinic.  She presents for elective C5-6 ACDF.  PMH: Past Medical History:  Diagnosis Date  . Anxiety   . Complication of anesthesia    "After last surgery in 06/09/2016, patient's BP dropped significantly"  . Headache    migraines  . Hypertension     PSH: Past Surgical History:  Procedure Laterality Date  . CERVICAL BIOPSY  03/2016  . CESAREAN SECTION     x3  . CHOLECYSTECTOMY    . CO2 LASER APPLICATION N/A 06/09/2016   Procedure: CO2 LASER APPLICATION OF VULVA;  Surgeon: De Blanchaniel Clarke-Pearson, MD;  [facility];  Service: Gynecology;  Laterality: N/A;  . LEEP  1995  . TUBAL LIGATION      SH: Social History   Tobacco Use  . Smoking status: Current Every Day Smoker    Packs/day: 0.25    Types: Cigarettes  . Smokeless tobacco: Never Used  Substance Use Topics  . Alcohol use: No  . Drug use: No    MEDS: Prior to Admission medications   Medication Sig Start Date End Date Taking? Authorizing Provider  Aspirin-Acetaminophen-Caffeine (GOODY HEADACHE PO) Take 1 packet by mouth daily as needed (for pain or headache).   Yes [provider]  clonazePAM (KLONOPIN) 1 MG tablet TAKE 1 MG BY MOUTH TWICE DAILY AS NEEDED FOR ANXIETY 05/04/16  Yes [provider]  levothyroxine (SYNTHROID, LEVOTHROID) 125 MCG tablet Take 100 mcg by mouth daily before breakfast.    Yes [provider]  metoprolol succinate (TOPROL-XL) 100 MG 24 hr tablet Take 100 mg by mouth daily. Take with or immediately following a meal.   Yes [provider]  Cetirizine HCl (ZYRTEC ALLERGY) 10 MG CAPS Take 1 capsule (10 mg total) by mouth daily as needed. Patient not taking: Reported on 06/23/2016 07/14/15   Devoria AlbeKnapp, Iva, MD   oxyCODONE-acetaminophen (PERCOCET/ROXICET) 5-325 MG tablet Take 1-2 tablets by mouth every 4 (four) hours as needed for severe pain. Do not take and drive. Patient not taking: Reported on 06/23/2016 06/12/16   Warner Mccreedyross, Melissa D, NP    ALLERGY: Allergies  Allergen Reactions  . Ciprofloxacin Hives and Shortness Of Breath  . Doxycycline Hives and Shortness Of Breath    ROS: ROS  NEUROLOGIC EXAM: Awake, alert, oriented Memory and concentration grossly intact Speech fluent, appropriate CN grossly intact Motor exam: Upper Extremities Deltoid Bicep Tricep Grip  Right 5/5 4/5 5/5 5/5  Left 5/5 5/5 5/5 5/5   Lower Extremity IP Quad PF DF EHL  Right 5/5 5/5 5/5 5/5 5/5  Left 5/5 5/5 5/5 5/5 5/5   Sensation grossly intact to LT  IMAGING: No new imaging  IMPRESSION: - 42 y.o. female with cervical radiculopathy due to a right C5-6 disc bulge.  She is neurologically intact except for a little bit of right biceps and brachioradialis weakness.  PLAN: - C5-6 ACDF - Likely home tonight

## 2017-09-17 NOTE — Progress Notes (Signed)
Orthopedic Tech Progress Note Patient Details:  Katelyn Thornton 03-Apr-1976 [id number]  Ortho Devices Type of Ortho Device: Soft collar Ortho Device/Splint Location: neck Ortho Device/Splint Interventions: Application   Post Interventions Patient Tolerated: Well Instructions Provided: Care of device   Nikki DomCrawford, [name] 09/17/2017, 2:23 PM

## 2017-09-17 NOTE — Anesthesia Postprocedure Evaluation (Signed)
Anesthesia Post Note  Patient: Katelyn Thornton  Procedure(s) Performed: Cervical Five- Six Anterior cervical discectomy with fusion and plate fixation (N/A )     Patient location during evaluation: PACU Anesthesia Type: General Level of consciousness: awake and alert Pain management: pain level controlled Vital Signs Assessment: post-procedure vital signs reviewed and stable Respiratory status: spontaneous breathing, nonlabored ventilation and respiratory function stable Cardiovascular status: blood pressure returned to baseline and stable Postop Assessment: no apparent nausea or vomiting Anesthetic complications: no    Last Vitals:  Vitals:   09/17/17 1250 09/17/17 1304  BP: 114/79 106/85  Pulse: 69 85  Resp: 12 13  Temp:  36.5 C  SpO2: 98% 100%    Last Pain:  Vitals:   09/17/17 1240  TempSrc:   PainSc: Asleep    LLE Motor Response: Purposeful movement;Responds to commands (09/17/17 1304) LLE Sensation: Full sensation;No numbness;No pain;No tingling (09/17/17 1304) RLE Motor Response: Purposeful movement;Responds to commands (09/17/17 1304) RLE Sensation: Full sensation;No numbness;No pain;No tingling (09/17/17 1304)      Dartanian Knaggs,W. EDMOND

## 2017-09-17 NOTE — Evaluation (Signed)
Physical Therapy Evaluation Patient Details Name: Katelyn Thornton MRN: [id number] DOB: 03/02/1976 Today's Date: 09/17/2017   History of Present Illness  Pt is a 42 y/o female s/p C5-6 ACDF. PMH includes HTN and anxiety.   Clinical Impression  Pt s/p surgery above with deficits below. Pt very guarded during gait and slightly unsteady requiring min guard to supervision. Educated about precautions and assist for home management. Will continue to follow acutely to maximize functional mobility independence and safety.     Follow Up Recommendations No PT follow up;Supervision for mobility/OOB    Equipment Recommendations  3in1 (PT)    Recommendations for Other Services       Precautions / Restrictions Precautions Precautions: Cervical Precaution Comments: Reviewed cervical precaution handout with pt.  Required Braces or Orthoses: Cervical Brace Cervical Brace: Soft collar;For comfort Restrictions Weight Bearing Restrictions: No      Mobility  Bed Mobility Overal bed mobility: Needs Assistance Bed Mobility: Supine to Sit;Sit to Supine     Supine to sit: Supervision Sit to supine: Supervision   General bed mobility comments: Supervision for safety. Educated about use of log roll technique for home when getting into and out of bed.   Transfers Overall transfer level: Needs assistance Equipment used: None Transfers: Sit to/from Stand Sit to Stand: Min guard         General transfer comment: Min guard for safety.   Ambulation/Gait Ambulation/Gait assistance: Min guard;Supervision Ambulation Distance (Feet): 150 Feet Assistive device: None Gait Pattern/deviations: Step-through pattern;Decreased stride length Gait velocity: Decreased  Gait velocity interpretation: Below normal speed for age/gender General Gait Details: Slow, very guarded gait. Slightly unsteady as pt reports feeling woozy. Min guard to supervision for safety. Educated about generalized walking program  to perform at home.   Stairs Stairs: (Educated about technique and assist; refused to practice )          Engineer, drilling Rankin (Stroke Patients Only)       Balance Overall balance assessment: Needs assistance Sitting-balance support: No upper extremity supported;Feet supported Sitting balance-Leahy Scale: Good     Standing balance support: No upper extremity supported;During functional activity Standing balance-Leahy Scale: Fair                               Pertinent Vitals/Pain Pain Assessment: Faces Faces Pain Scale: Hurts whole lot Pain Location: neck  Pain Descriptors / Indicators: Aching;Operative site guarding Pain Intervention(s): Limited activity within patient's tolerance;Monitored during session;Repositioned    Home Living Family/patient expects to be discharged to:: Private residence Living Arrangements: Children;Parent Available Help at Discharge: Family;Available 24 hours/day Type of Home: House Home Access: Stairs to enter   [employer] of Steps: 1(porch step ) Home Layout: One level Home Equipment: None      Prior Function Level of Independence: Independent               Hand Dominance        Extremity/Trunk Assessment   Upper Extremity Assessment Upper Extremity Assessment: Defer to OT evaluation    Lower Extremity Assessment Lower Extremity Assessment: Overall WFL for tasks assessed    Cervical / Trunk Assessment Cervical / Trunk Assessment: Other exceptions Cervical / Trunk Exceptions: s/p ACDF   Communication   Communication: No difficulties  Cognition Arousal/Alertness: Awake/alert Behavior During Therapy: WFL for tasks assessed/performed Overall Cognitive Status: Within Functional Limits for tasks assessed  General Comments General comments (skin integrity, edema, etc.): Pt's sister and daughter present during session. Pt  requesting brace; RN notified and ortho tech brought during session.     Exercises     Assessment/Plan    PT Assessment Patient needs continued PT services  PT Problem List Decreased balance;Decreased mobility;Decreased knowledge of precautions;Pain       PT Treatment Interventions Gait training;Stair training;Functional mobility training;Therapeutic activities;Therapeutic exercise;Balance training;Neuromuscular re-education;Patient/family education    PT Goals (Current goals can be found in the Care Plan section)  Acute Rehab PT Goals Patient Stated Goal: to decrease pain  PT Goal Formulation: With patient Time For Goal Achievement: 10/01/17 Potential to Achieve Goals: Good    Frequency Min 5X/week   Barriers to discharge        Co-evaluation               AM-PAC PT "6 Clicks" Daily Activity  Outcome Measure Difficulty turning over in bed (including adjusting bedclothes, sheets and blankets)?: A Little Difficulty moving from lying on back to sitting on the side of the bed? : A Little Difficulty sitting down on and standing up from a chair with arms (e.g., wheelchair, bedside commode, etc,.)?: Unable Help needed moving to and from a bed to chair (including a wheelchair)?: A Little Help needed walking in hospital room?: A Little Help needed climbing 3-5 steps with a railing? : A Little 6 Click Score: 16    End of Session Equipment Utilized During Treatment: Gait belt;Cervical collar Activity Tolerance: Patient tolerated treatment well Patient left: in bed;with call bell/phone within reach;with family/visitor present Nurse Communication: Mobility status PT Visit Diagnosis: Other abnormalities of gait and mobility (R26.89);Pain Pain - part of body: (neck )    Time: [id number] PT Time Calculation (min) (ACUTE ONLY): 23 min   Charges:   PT Evaluation $PT Eval Low Complexity: 1 Low PT Treatments $Gait Training: 8-22 mins   PT G Codes:        Gladys DammeBrittany  Batina Dougan, PT, DPT  Acute Rehabilitation Services  Pager: [phone]   Lehman PromBrittany S Loveah Like 09/17/2017, 2:39 PM

## 2017-09-17 NOTE — Anesthesia Procedure Notes (Signed)
Procedure Name: Intubation Date/Time: 09/17/2017 8:27 PM Performed by: Eligha Bridegroom, CRNA Pre-anesthesia Checklist: Patient identified, Emergency Drugs available, Suction available, Patient being monitored and Timeout performed Patient Re-evaluated:Patient Re-evaluated prior to induction Oxygen Delivery Method: Circle system utilized Preoxygenation: Pre-oxygenation with 100% oxygen Induction Type: IV induction Ventilation: Mask ventilation without difficulty Laryngoscope Size: Mac and 3 Grade View: Grade II Tube type: Oral Tube size: 7.0 mm Number of attempts: 1 Airway Equipment and Method: Stylet Placement Confirmation: ETT inserted through vocal cords under direct vision,  positive ETCO2 and breath sounds checked- equal and bilateral Secured at: 21 cm Tube secured with: Tape Dental Injury: Teeth and Oropharynx as per pre-operative assessment

## 2017-09-17 NOTE — Anesthesia Preprocedure Evaluation (Addendum)
Anesthesia Evaluation  Patient identified by MRN, date of birth, ID band Patient awake    Reviewed: Allergy & Precautions, NPO status , Patient's Chart, lab work & pertinent test results, reviewed documented beta blocker date and time   Airway Mallampati: II  TM Distance: >3 FB Neck ROM: Full    Dental no notable dental hx. (+) Dental Advisory Given   Pulmonary Current Smoker,    Pulmonary exam normal breath sounds clear to auscultation       Cardiovascular hypertension, Pt. on medications and Pt. on home beta blockers Normal cardiovascular exam Rhythm:Regular Rate:Normal  EKG - SB   Neuro/Psych  Headaches, Anxiety MRI -  Right paracentral disc protrusion at C5-6, with lateral extension into the right neural foramen. Secondary flattening of the right hemi cord with moderate to severe right C6 foraminal stenosis. Mild disc bulging at C6-7 with resultant mild bilateral C7 foraminal stenosis, also similar to previous MRI.    GI/Hepatic negative GI ROS, Neg liver ROS,   Endo/Other  Obesity  Renal/GU negative Renal ROS  negative genitourinary   Musculoskeletal negative musculoskeletal ROS (+)   Abdominal   Peds negative pediatric ROS (+)  Hematology negative hematology ROS (+)   Anesthesia Other Findings   Reproductive/Obstetrics                            Anesthesia Physical  Anesthesia Plan  ASA: II  Anesthesia Plan: General   Post-op Pain Management:    Induction: Intravenous  PONV Risk Score and Plan: 3 and Dexamethasone, Ondansetron, Midazolam and Treatment may vary due to age or medical condition  Airway Management Planned: Oral ETT  Additional Equipment: None  Intra-op Plan:   Post-operative Plan: Extubation in OR  Informed Consent: I have reviewed the patients History and Physical, chart, labs and discussed the procedure including the risks, benefits and alternatives for the  proposed anesthesia with the patient or authorized representative who has indicated his/her understanding and acceptance.   Dental advisory given  Plan Discussed with: CRNA  Anesthesia Plan Comments:         Anesthesia Quick Evaluation

## 2017-09-17 NOTE — Op Note (Signed)
09/17/2017  12:03 PM  PATIENT:  Katelyn Thornton  42 y.o. female  PRE-OPERATIVE DIAGNOSIS:  Cervical spondylosis with radiculopathy  POST-OPERATIVE DIAGNOSIS:  Same  PROCEDURE:  C5-6 anterior discectomy with fusion and plate fixation; use of PEEK spacer  SURGEON:  Hulan SaasBenjamin J. 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  I recommended the above procedure.  Patient understood the risks, benefits, and alternatives and potential outcomes and wished to proceed.  PROCEDURE DETAILS: Patient was brought to the operating room placed under general endotracheal anesthesia. Patient was placed in the supine position on the operating room table. The neck was prepped with betadine and chloraprep and draped in a sterile fashion.     Local anesthestic was injected and a transverse incision was made on the right side of the neck.  Dissection was carried down thru the subcutaneous tissue and the platysma was  elevated, opened, and undermined with Metzenbaum scissors.  Dissection was then carried out thru an avascular plane leaving the sternocleidomastoid, carotid artery, and jugular vein laterally and the trachea and esophagus medially. The ventral aspect of the vertebral column was identified and a localizing x-ray was taken. The C5-6 level was identified. The longus colli muscles were then elevated and the retractor was placed. The annulus was incised and the disc space entered. Discectomy was performed with micro-curettes and pituitary and kerrison rongeurs. I then used the high-speed drill to drill the endplates down to the level of the posterior longitudinal ligament. The operating microscope was draped and brought into the field provided additional magnification, illumination and visualization. Utilizing microsurgical  technique, discectomy was continued posteriorly thru the disc space. Posterior longitudinal ligament was opened with a nerve hook, and then removed along with disc herniation and osteophytes, decompressing the spinal canal and thecal sac. We then continued to remove osteophytic overgrowth and disc material decompressing the neural foramina and exiting nerve roots bilaterally. So by both visualization and palpation we felt we had an adequate decompression of the neural elements. We then measured the height of the intravertebral disc space and selected a Peek interbody cage packed with morcellized allograft. It was then gently positioned in the intravertebral disc space and countersunk. I then used a titanium plate and placed two variable angle screws into the vertebral body of C5 and two fixed angle screws in C6 and locked them into position. The wound was irrigated with bacitracin solution, checked for hemostasis which was established and confirmed. Once meticulous hemostasis was achieved, we then proceeded with closure. A JP drain was placed.  The platysma was closed with interrupted 3-0 undyed Vicryl suture, the subcuticular layer was closed with interrupted 3-0 undyed Vicryl suture. The skin edges were approximated with dermabond. The drapes were removed. A sterile dressing was applied. The patient was then awakened from general anesthesia and transferred to the recovery room in stable condition. At the end of the procedure all sponge, needle and instrument counts were correct.   PATIENT DISPOSITION:  PACU - hemodynamically stable.   Delay start of Pharmacological VTE agent (>24hrs) due to surgical blood loss or risk of bleeding:  yes

## 2017-09-18 ENCOUNTER — Encounter (HOSPITAL_COMMUNITY): Payer: Self-pay | Admitting: Neurological Surgery

## 2017-09-18 MED FILL — Thrombin For Soln 5000 Unit: CUTANEOUS | Qty: 5000 | Status: AC

## 2017-11-07 ENCOUNTER — Other Ambulatory Visit: Payer: Self-pay | Admitting: Physician Assistant

## 2017-11-07 DIAGNOSIS — M4722 Other spondylosis with radiculopathy, cervical region: Secondary | ICD-10-CM

## 2017-11-17 IMAGING — MR MR CERVICAL SPINE W/O CM
4 of 6 series · 25 of 48 positions shown · non-contrast
Comparison: Prior MRI from 09/21/2015.

CLINICAL DATA: Initial evaluation for neck pain with right shoulder
pain with numbness in fingers.

EXAM:
MRI CERVICAL SPINE WITHOUT CONTRAST
TECHNIQUE: Multiplanar, multisequence MR imaging of the cervical spine was
performed. No intravenous contrast was administered.

[Series 3: T2 · sagittal · 3.0mm · 0.41mm/px · 6 of 13 slices shown (1 of 3)]
[im 1/13]
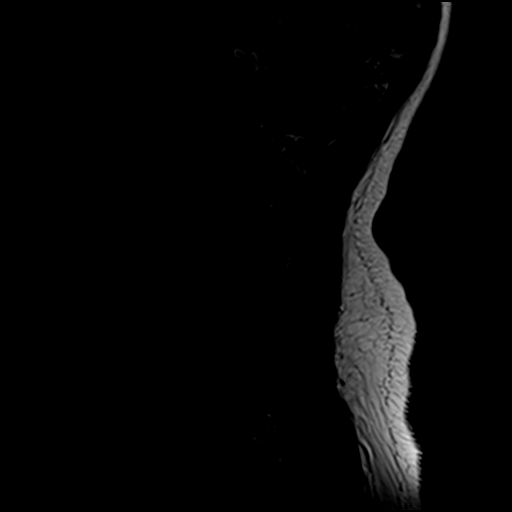
[im 3/13]
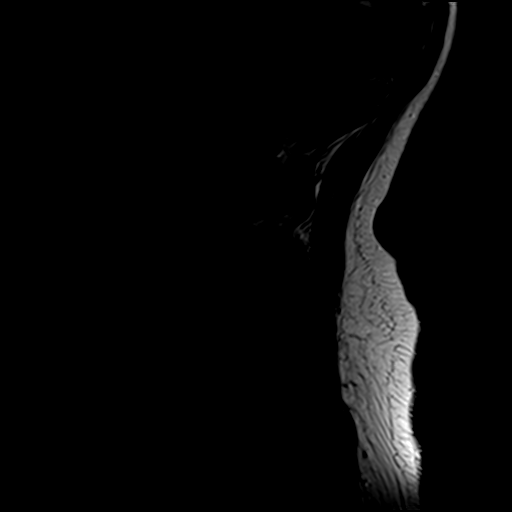
[im 5/13]
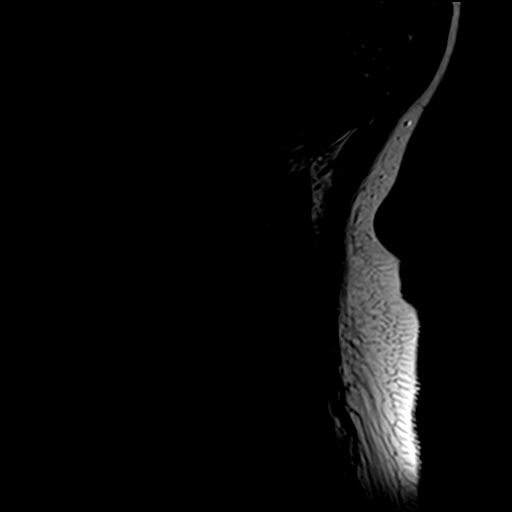
[im 8/13]
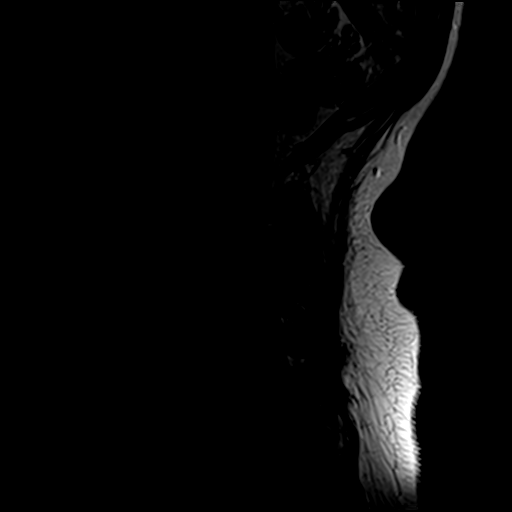
[im 10/13]
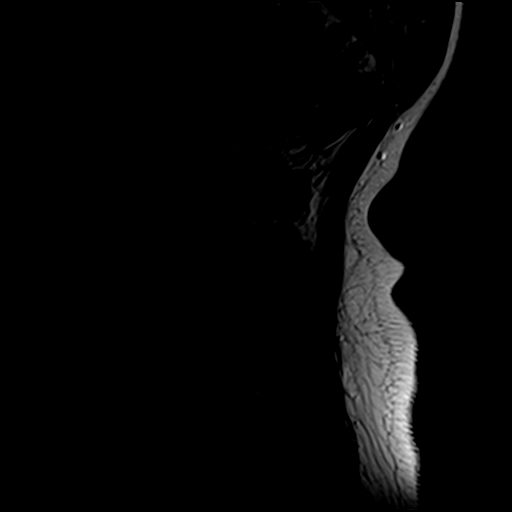
[im 13/13]
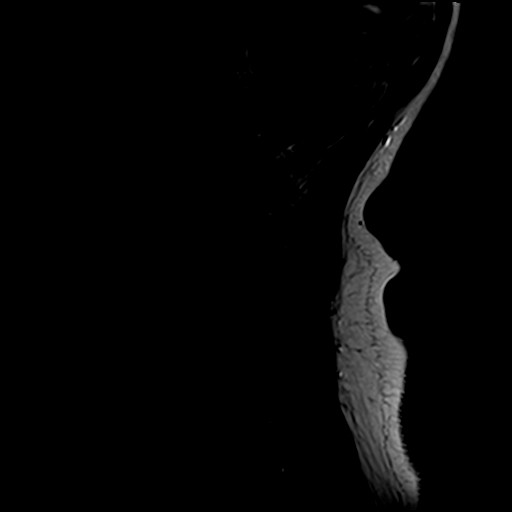

[Series 4: T1 · sagittal · 3.0mm · 0.41mm/px · 4 of 13 slices shown]
[im 1/13]
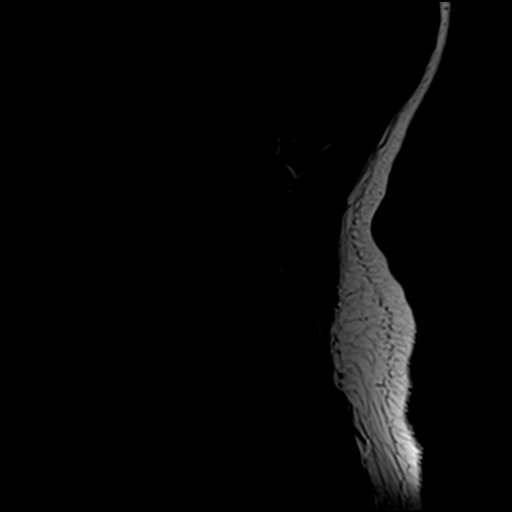
[im 3/13]
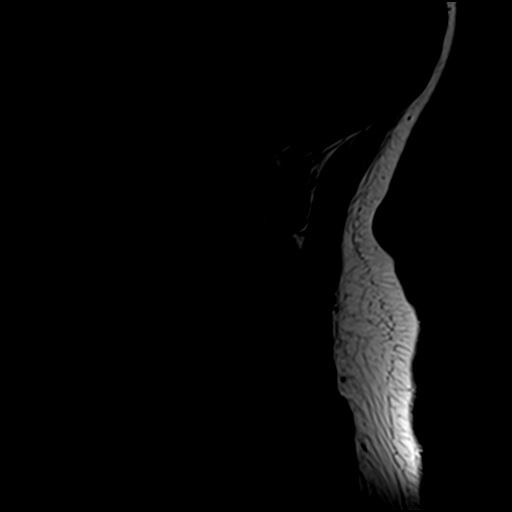
[im 8/13]
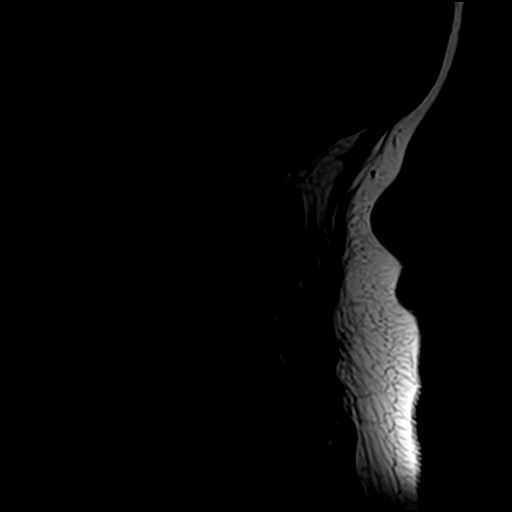
[im 13/13]
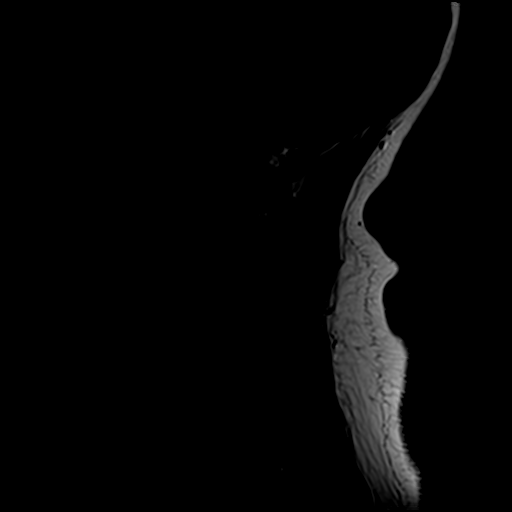

[Series 7: T2 · axial · 3.0mm · 0.70mm/px · z∈[-31,+60]mm · 9 of 26 slices shown (2 of 3)]
[im 1/26]
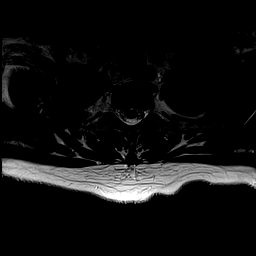
[im 5/26]
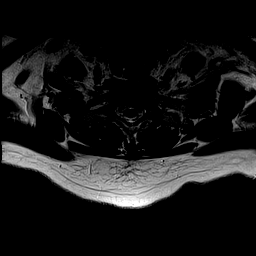
[im 7/26]
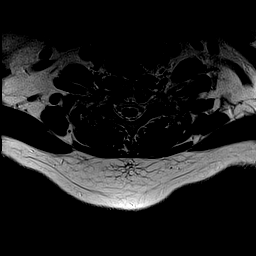
[im 12/26]
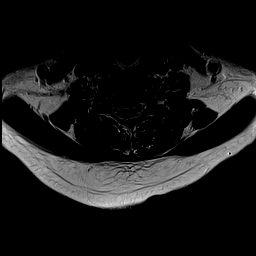
[im 14/26]
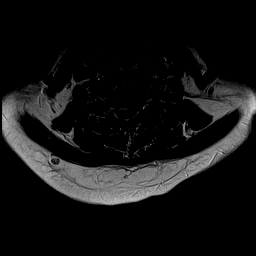
[im 19/26]
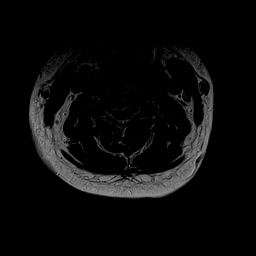
[im 21/26]
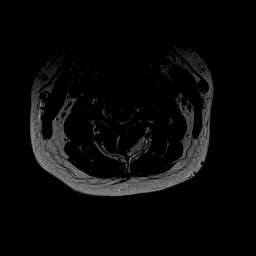
[im 23/26]
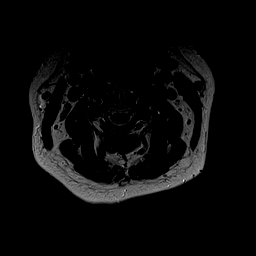
[im 26/26]
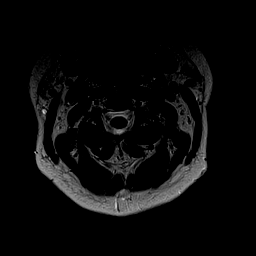

[Series 8: T2 · sagittal · 3.0mm · 0.41mm/px · 6 of 13 slices shown (3 of 3)]
[im 1/13]
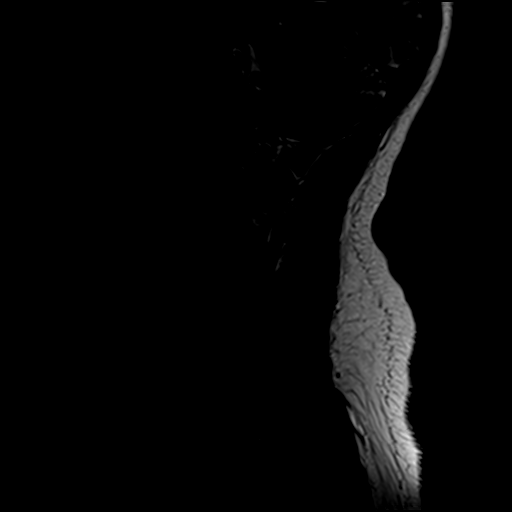
[im 3/13]
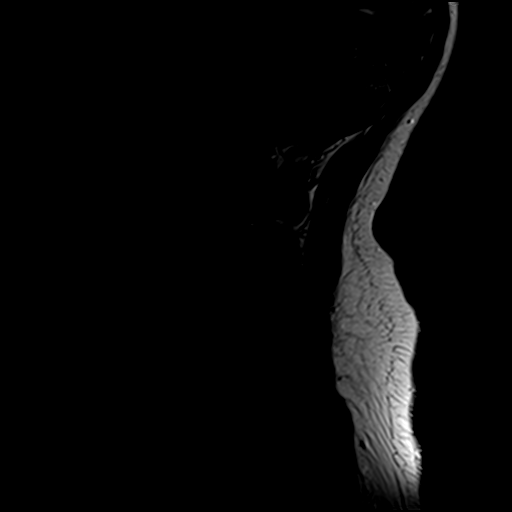
[im 5/13]
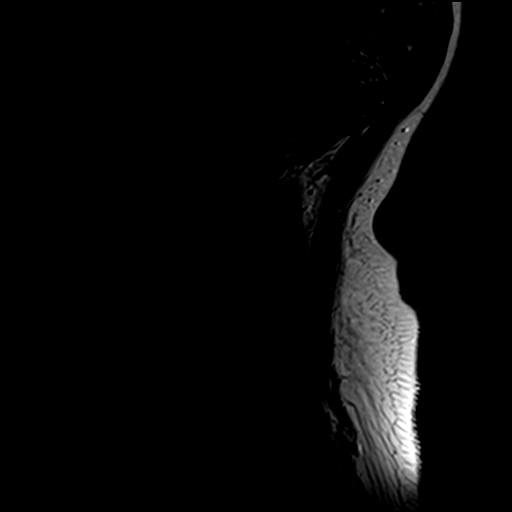
[im 8/13]
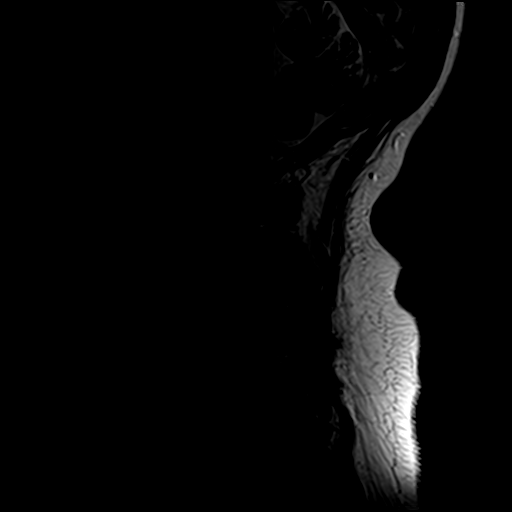
[im 10/13]
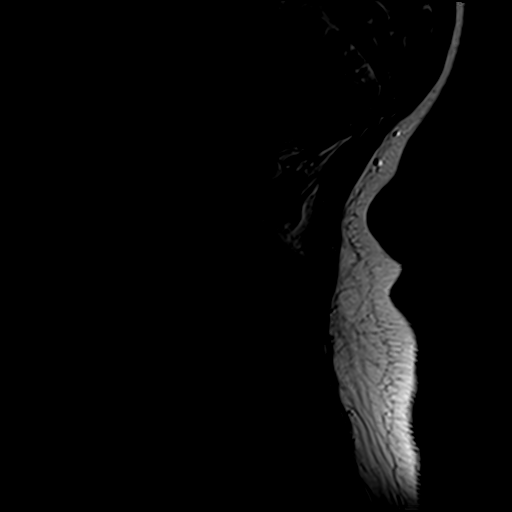
[im 13/13]
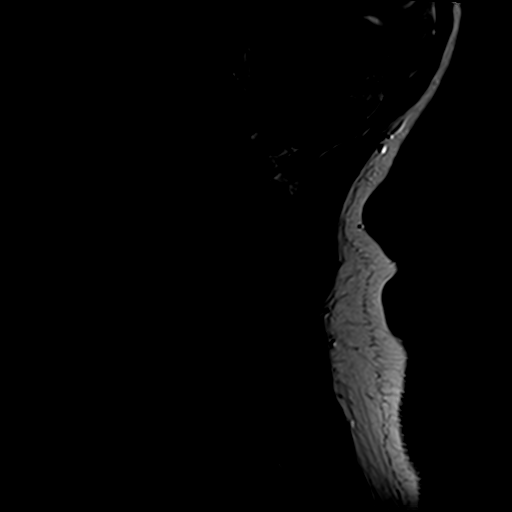

[25 of 48 positions shown; findings below may reference images not displayed]

FINDINGS: Alignment:  Study moderately degraded by motion artifact.

Straightening of the normal cervical lordosis.

Vertebrae: Vertebral body heights maintained. No evidence for acute
or chronic fracture. Bone marrow signal intensity within normal
limits. No discrete or worrisome osseous lesions. No abnormal marrow
edema.

Cord: Signal intensity within the cervical spinal cord within normal
limits.

Posterior Fossa, vertebral arteries, paraspinal tissues: Visualized
brain and posterior fossa within normal limits. Craniocervical
junction within normal limits. Paraspinous and prevertebral soft
tissues normal. Normal intravascular flow voids present within the
vertebral arteries bilaterally.

Disc levels:

C2-C3: Unremarkable.

C3-C4:  Unremarkable.

C4-C5:  Unremarkable.

C5-C6: Broad right paracentral disc protrusion, extending laterally
into the right neural foramen. Secondary flattening of the right
hemi cord without cord signal changes superimposed mild
uncovertebral disease bilaterally. Mild spinal stenosis with
moderate to severe right C6 foraminal stenosis. Overall, size of
this protrusion is relatively similar to previous. Mild left C6
foraminal narrowing.

C6-C7: Diffuse disc bulge with mild uncovertebral hypertrophy. Mild
flattening of the ventral thecal sac without significant spinal
stenosis. Mild bilateral C7 foraminal narrowing. Appearance is
similar to previous.

C7-T1:  Unremarkable.

Upper thoracic spine within normal limits.
IMPRESSION: 1. Right paracentral disc protrusion at C5-6, with lateral extension
into the right neural foramen. Secondary flattening of the right
hemi cord with moderate to severe right C6 foraminal stenosis.
Overall, finding is relatively similar to previous MRI from [DATE]. Mild disc bulging at C6-7 with resultant mild bilateral C7
foraminal stenosis, also similar.

## 2017-11-22 ENCOUNTER — Ambulatory Visit
Admission: RE | Admit: 2017-11-22 | Discharge: 2017-11-22 | Disposition: A | Discharge status: Home / Self Care | Payer: Medicaid Other | Source: Ambulatory Visit | Attending: Physician Assistant | Admitting: Physician Assistant

## 2017-11-22 DIAGNOSIS — M4722 Other spondylosis with radiculopathy, cervical region: Secondary | ICD-10-CM

## 2018-01-30 ENCOUNTER — Other Ambulatory Visit: Payer: Self-pay | Admitting: Neurosurgery

## 2018-01-30 ENCOUNTER — Other Ambulatory Visit (HOSPITAL_COMMUNITY): Payer: Self-pay | Admitting: Neurosurgery

## 2018-01-30 DIAGNOSIS — M4722 Other spondylosis with radiculopathy, cervical region: Secondary | ICD-10-CM

## 2018-01-30 MED ORDER — SODIUM CHLORIDE 0.9 % IV SOLN: 4.0000 mg | Freq: Four times a day (QID) | INTRAVENOUS | Status: AC | PRN

## 2018-02-04 IMAGING — RF DG CERVICAL SPINE 1V
1 series · 2 of 2 positions shown · non-contrast
Comparison: 08/16/2015

CLINICAL DATA: C5-C6 anterior cervical discectomy with fusion and
plate fixation

EXAM:
CERVICAL SPINE 1 VIEW

[Series 1: run · 2 of 2 slices shown]
[im 1/2]
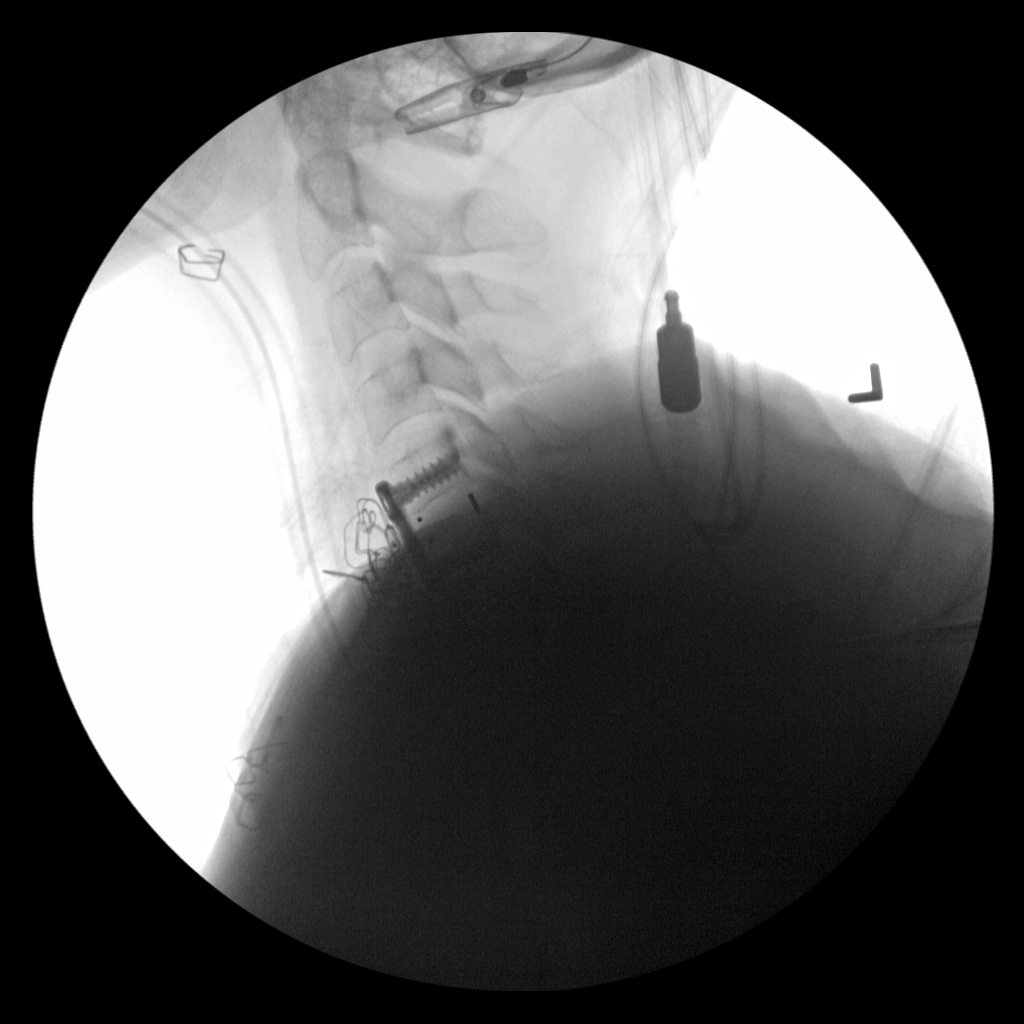
[im 2/2]
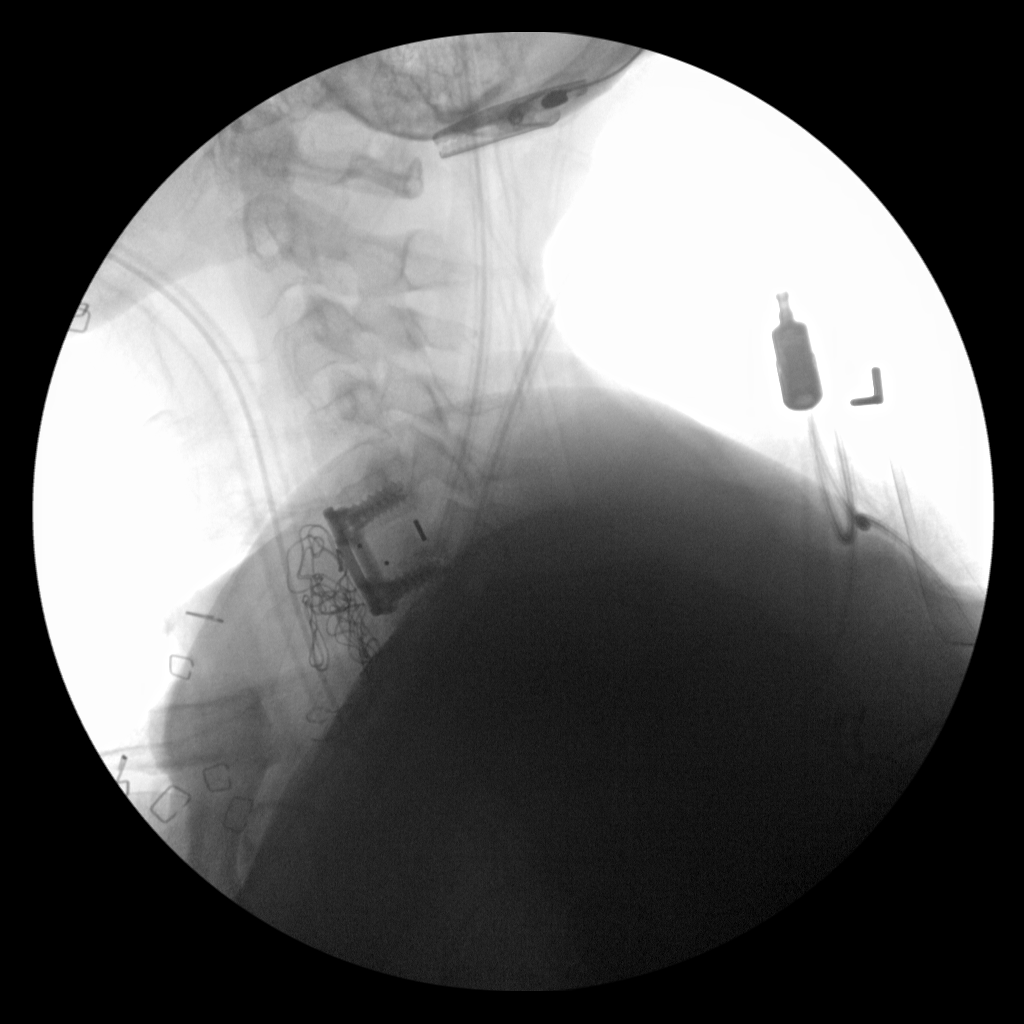

[2 of 2 positions shown; findings below may reference images not displayed]

FINDINGS: Bones appear demineralized.

Anterior plate and screws present at C5-C6 with intervening disc
prosthesis.

Surgical sponge projects anterior to the C5-C6 surgical level.

Vertebral body heights maintained.

C7 not visualized.
IMPRESSION: Anterior fusion C5-C6.

## 2018-02-06 ENCOUNTER — Ambulatory Visit (HOSPITAL_COMMUNITY): Payer: Medicaid Other

## 2018-02-06 ENCOUNTER — Ambulatory Visit (HOSPITAL_COMMUNITY): Admission: RE | Admit: 2018-02-06 | Payer: Medicaid Other | Source: Ambulatory Visit

## 2018-03-21 ENCOUNTER — Other Ambulatory Visit (HOSPITAL_COMMUNITY): Payer: Self-pay | Admitting: Neurosurgery

## 2018-03-21 ENCOUNTER — Other Ambulatory Visit: Payer: Self-pay | Admitting: Neurosurgery

## 2018-03-21 DIAGNOSIS — M4722 Other spondylosis with radiculopathy, cervical region: Secondary | ICD-10-CM

## 2018-03-21 MED ORDER — SODIUM CHLORIDE 0.9 % IV SOLN: 4.0000 mg | Freq: Four times a day (QID) | INTRAVENOUS | Status: AC | PRN

## 2018-03-29 ENCOUNTER — Ambulatory Visit (HOSPITAL_COMMUNITY): Admission: RE | Admit: 2018-03-29 | Payer: Medicaid Other | Source: Ambulatory Visit

## 2018-03-29 ENCOUNTER — Ambulatory Visit (HOSPITAL_COMMUNITY): Payer: Medicaid Other

## 2018-04-19 ENCOUNTER — Ambulatory Visit (HOSPITAL_COMMUNITY): Admission: RE | Admit: 2018-04-19 | Payer: Medicaid Other | Source: Ambulatory Visit

## 2018-04-29 ENCOUNTER — Other Ambulatory Visit: Payer: Self-pay | Admitting: Neurosurgery

## 2018-04-30 MED ORDER — SODIUM CHLORIDE 0.9 % IV SOLN: 4.0000 mg | Freq: Four times a day (QID) | INTRAVENOUS | Status: AC | PRN

## 2018-05-22 ENCOUNTER — Ambulatory Visit (HOSPITAL_COMMUNITY): Admission: RE | Admit: 2018-05-22 | Payer: Medicaid Other | Source: Ambulatory Visit

## 2018-06-13 ENCOUNTER — Ambulatory Visit (HOSPITAL_COMMUNITY): Payer: Medicaid Other | Attending: Neurosurgery

## 2018-06-13 ENCOUNTER — Ambulatory Visit (HOSPITAL_COMMUNITY): Admission: RE | Admit: 2018-06-13 | Payer: Medicaid Other | Source: Ambulatory Visit

## 2018-08-22 ENCOUNTER — Other Ambulatory Visit (HOSPITAL_COMMUNITY): Payer: Self-pay | Admitting: Neurosurgery

## 2018-08-22 ENCOUNTER — Other Ambulatory Visit: Payer: Self-pay | Admitting: Neurosurgery

## 2018-08-22 DIAGNOSIS — M4722 Other spondylosis with radiculopathy, cervical region: Secondary | ICD-10-CM

## 2018-08-27 ENCOUNTER — Other Ambulatory Visit: Payer: Self-pay | Admitting: Student

## 2018-08-30 ENCOUNTER — Ambulatory Visit (HOSPITAL_COMMUNITY): Admission: RE | Admit: 2018-08-30 | Payer: Medicaid Other | Source: Ambulatory Visit

## 2018-08-30 ENCOUNTER — Other Ambulatory Visit (HOSPITAL_COMMUNITY): Payer: Medicaid Other

## 2019-04-22 ENCOUNTER — Ambulatory Visit: Payer: Medicaid Other | Admitting: Family Medicine

## 2019-04-30 ENCOUNTER — Encounter: Payer: Self-pay | Admitting: General Practice

## 2022-05-02 ENCOUNTER — Other Ambulatory Visit: Payer: Self-pay | Admitting: *Deleted

## 2022-05-02 DIAGNOSIS — M79673 Pain in unspecified foot: Secondary | ICD-10-CM

## 2022-05-10 ENCOUNTER — Encounter: Payer: Medicaid Other | Admitting: Vascular Surgery

## 2022-05-17 ENCOUNTER — Encounter: Payer: Medicaid Other | Admitting: Vascular Surgery

## 2022-05-31 ENCOUNTER — Encounter: Payer: Medicaid Other | Admitting: Vascular Surgery

## 2022-06-07 ENCOUNTER — Encounter: Payer: Self-pay | Admitting: Vascular Surgery

## 2022-06-07 ENCOUNTER — Ambulatory Visit (INDEPENDENT_AMBULATORY_CARE_PROVIDER_SITE_OTHER): Payer: Medicaid Other | Admitting: Vascular Surgery

## 2022-06-07 VITALS — BP 125/87 | HR 77 | Temp 98.9°F | Ht 62.0 in | Wt 171.6 lb

## 2022-06-07 DIAGNOSIS — I75022 Atheroembolism of left lower extremity: Secondary | ICD-10-CM

## 2022-06-07 NOTE — Progress Notes (Signed)
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  She is here today with her mother.  She had an episode in late July of pain and discoloration of her left fourth toe.  She has a picture on her cell phone from this time.  She had sudden onset.  She denies any trauma to her foot.  She had extreme pain associated with this for several weeks and eventual complete resolution of discoloration changes.  She still has some discomfort on the plantar aspect of her distal left foot.  No history of cardiac disease.  She has no history of stroke.  She had taken aspirin briefly following this event but has stopped this.  She is not on anticoagulation otherwise.  She underwent work-up to include noninvasive lower extremity studies and CT angiogram of her aorta pelvis and runoff.  These were done at [facility] and I have these results for review.  Past Medical History:  Diagnosis Date   Anxiety    Complication of anesthesia    "After last surgery in 06/09/2016, patient's BP dropped significantly"   Headache    migraines   Hypertension    Hypothyroid     Family History  Problem Relation Age of Onset   Cancer Mother    Stroke Mother    Cancer Father    Cancer Sister    Cancer Maternal Grandfather     SOCIAL HISTORY: Social History   Socioeconomic History   Marital status: Single    Spouse name: Not on file   Number of children: Not on file   Years of education: Not on file   Highest education level: Not on file  Occupational History   Not on file  Tobacco Use   Smoking status: Every Day    Packs/day: 0.25    Types: Cigarettes   Smokeless tobacco: Never  Vaping Use   Vaping Use: Never used  Substance and Sexual  Activity   Alcohol use: No   Drug use: No   Sexual activity: Yes    Birth control/protection: None  Other Topics Concern   Not on file  Social History Narrative   Not on file   Social Determinants of Health   Financial Resource Strain: Not on file  Food Insecurity: Not on file  Transportation Needs: Not on file  Physical Activity: Not on file  Stress: Not on file  Social Connections: Not on file  Intimate Partner Violence: Not on file    Allergies  Allergen Reactions   Ciprofloxacin Hives and Shortness Of Breath   Doxycycline Hives and Shortness Of Breath    Current Outpatient Medications  Medication Sig Dispense Refill   clonazePAM (KLONOPIN) 1 MG tablet TAKE 1 MG BY MOUTH TWICE DAILY AS NEEDED FOR ANXIETY     levothyroxine (SYNTHROID, LEVOTHROID) 125 MCG tablet Take 100 mcg by mouth daily before breakfast.      metoprolol succinate (TOPROL-XL) 100 MG 24 hr tablet Take 100 mg by mouth daily. Take with or immediately following a meal.     Aspirin-Acetaminophen-Caffeine (GOODY HEADACHE PO) Take 1 packet by mouth daily as needed (for pain or headache). (Patient not taking: Reported on 06/07/2022)     Cetirizine HCl (ZYRTEC ALLERGY) 10 MG CAPS Take 1 capsule (  10 mg total) by mouth daily as needed. (Patient not taking: Reported on 06/23/2016) 14 capsule 0   gabapentin (NEURONTIN) 300 MG capsule Take 1 capsule (300 mg total) by mouth 3 (three) times daily. (Patient not taking: Reported on 06/07/2022) 90 capsule 2   methocarbamol (ROBAXIN) 750 MG tablet Take 1 tablet (750 mg total) by mouth 4 (four) times daily. (Patient not taking: Reported on 06/07/2022) 120 tablet 2   No current facility-administered medications for this visit.   Facility-Administered Medications Ordered in Other Visits  Medication Dose Route Frequency Provider Last Rate Last Admin   ondansetron (ZOFRAN) 4 mg in sodium chloride 0.9 % 50 mL IVPB  4 mg Intravenous Q6H PRN Coletta Memos, MD       ondansetron  (ZOFRAN) 4 mg in sodium chloride 0.9 % 50 mL IVPB  4 mg Intravenous Q6H PRN Coletta Memos, MD       ondansetron (ZOFRAN) 4 mg in sodium chloride 0.9 % 50 mL IVPB  4 mg Intravenous Q6H PRN Coletta Memos, MD        REVIEW OF SYSTEMS:  [X]  denotes positive finding, [ ]  denotes negative finding Cardiac  Comments:  Chest pain or chest pressure:    Shortness of breath upon exertion:    Short of breath when lying flat:    Irregular heart rhythm:        Vascular    Pain in calf, thigh, or hip brought on by ambulation:    Pain in feet at night that wakes you up from your sleep:     Blood clot in your veins:    Leg swelling:         Pulmonary    Oxygen at home:    Productive cough:     Wheezing:         Neurologic    Sudden weakness in arms or legs:     Sudden numbness in arms or legs:     Sudden onset of difficulty speaking or slurred speech:    Temporary loss of vision in one eye:     Problems with dizziness:         Gastrointestinal    Blood in stool:     Vomited blood:         Genitourinary    Burning when urinating:     Blood in urine:        Psychiatric    Major depression:         Hematologic    Bleeding problems:    Problems with blood clotting too easily:        Skin    Rashes or ulcers:        Constitutional    Fever or chills:      PHYSICAL EXAM: Vitals:   06/07/22 1352  BP: 125/87  Pulse: 77  Temp: 98.9 F (37.2 C)  SpO2: 99%  Weight: 171 lb 9.6 oz (77.8 kg)  Height: 5\' 2"  (1.575 m)    GENERAL: The patient is a well-nourished female, in no acute distress. The vital signs are documented above. CARDIOVASCULAR: 2+ radial pulses bilaterally.  2+ dorsalis pedis pulses bilaterally.  I do not appreciate a cardiac murmur. PULMONARY: There is good air exchange  MUSCULOSKELETAL: There are no major deformities or cyanosis. NEUROLOGIC: No focal weakness or paresthesias are detected. SKIN: There are no ulcers or rashes noted. PSYCHIATRIC: The patient has a  normal affect.  DATA:  Noninvasive studies from 03/31/2022 reveal ankle arm index of  0.83 on the right and 1.0 on the left  CT angiogram of abdomen pelvis and runoff from 04/01/2022 were reviewed.  This does show significant 80% narrowing in her right proximal common carotid artery.  Otherwise she has no evidence of irregularity or narrowing throughout her study on the left.  She has wide patency of her aortic and iliac segments and also of her left femoral-popliteal and runoff vessels.  MEDICAL ISSUES: Patient reports that she does have some calf discomfort if she is doing a great deal of fast walking on the right.  I explained that in all likelihood this is related to her right common iliac artery stenosis.  This is mildly limiting to her and I explained that I would certainly recommend observation unless she had progression of symptoms.  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  She has had a CT angiogram of her abdomen pelvis and runoff showing no source for this.  I have recommended 2D echocardiogram to rule out cardiogenic source and also CT angiogram of her chest to rule out thoracic aortic source.  I did explain in all likelihood that these would be negative but need to be ruled out.  I have suggested that she take an aspirin a day due to her apparent atheroemboli.  We will see her back for further discussion following the above studies   Larina Earthly, MD [facility] [phone] Pager [phone]  Note: Portions of this report may have been transcribed using voice recognition software.  Every effort has been made to ensure accuracy; however, inadvertent computerized transcription errors may still be present.

## 2022-06-08 ENCOUNTER — Other Ambulatory Visit: Payer: Self-pay

## 2022-06-08 DIAGNOSIS — I75022 Atheroembolism of left lower extremity: Secondary | ICD-10-CM

## 2022-06-09 ENCOUNTER — Ambulatory Visit (HOSPITAL_COMMUNITY)
Admission: RE | Admit: 2022-06-09 | Discharge: 2022-06-09 | Disposition: A | Discharge status: Home / Self Care | Payer: Medicaid Other | Source: Ambulatory Visit | Attending: Vascular Surgery | Admitting: Vascular Surgery

## 2022-06-09 DIAGNOSIS — I75022 Atheroembolism of left lower extremity: Secondary | ICD-10-CM | POA: Insufficient documentation

## 2022-06-09 MED ORDER — SODIUM CHLORIDE (PF) 0.9 % IJ SOLN
INTRAMUSCULAR | Status: AC
  Filled 2022-06-09: qty 50

## 2022-06-09 MED ORDER — IOHEXOL 350 MG/ML SOLN
75.0000 mL | Freq: Once | INTRAVENOUS | Status: AC | PRN
  Administered 2022-06-09: 75 mL via INTRAVENOUS

## 2022-06-26 ENCOUNTER — Other Ambulatory Visit: Payer: Self-pay

## 2022-06-26 DIAGNOSIS — I75022 Atheroembolism of left lower extremity: Secondary | ICD-10-CM

## 2022-06-26 NOTE — Addendum Note (Signed)
Addended by: Kaleen Mask on: 06/26/2022 11:10 AM   Modules accepted: Orders

## 2022-07-04 ENCOUNTER — Ambulatory Visit (HOSPITAL_COMMUNITY): Payer: Medicaid Other

## 2022-07-10 ENCOUNTER — Ambulatory Visit (HOSPITAL_COMMUNITY)
Admission: RE | Admit: 2022-07-10 | Discharge: 2022-07-10 | Disposition: A | Discharge status: Home / Self Care | Payer: Medicaid Other | Source: Ambulatory Visit | Attending: Vascular Surgery | Admitting: Vascular Surgery

## 2022-07-10 DIAGNOSIS — I75022 Atheroembolism of left lower extremity: Secondary | ICD-10-CM

## 2022-07-10 DIAGNOSIS — I2609 Other pulmonary embolism with acute cor pulmonale: Secondary | ICD-10-CM | POA: Diagnosis present

## 2022-07-10 DIAGNOSIS — F17200 Nicotine dependence, unspecified, uncomplicated: Secondary | ICD-10-CM | POA: Diagnosis not present

## 2022-07-10 DIAGNOSIS — I10 Essential (primary) hypertension: Secondary | ICD-10-CM | POA: Insufficient documentation

## 2022-07-10 LAB — ECHOCARDIOGRAM COMPLETE
Area-P 1/2: 4.41 cm2
S' Lateral: 2.8 cm

## 2023-02-02 ENCOUNTER — Other Ambulatory Visit: Payer: Self-pay

## 2023-02-02 ENCOUNTER — Other Ambulatory Visit (HOSPITAL_BASED_OUTPATIENT_CLINIC_OR_DEPARTMENT_OTHER): Payer: Self-pay

## 2023-02-02 ENCOUNTER — Encounter (HOSPITAL_BASED_OUTPATIENT_CLINIC_OR_DEPARTMENT_OTHER): Payer: Self-pay | Admitting: Student

## 2023-02-02 ENCOUNTER — Ambulatory Visit (HOSPITAL_BASED_OUTPATIENT_CLINIC_OR_DEPARTMENT_OTHER): Payer: Medicaid Other

## 2023-02-02 ENCOUNTER — Ambulatory Visit (INDEPENDENT_AMBULATORY_CARE_PROVIDER_SITE_OTHER): Payer: Medicaid Other | Admitting: Student

## 2023-02-02 DIAGNOSIS — M25511 Pain in right shoulder: Secondary | ICD-10-CM

## 2023-02-02 DIAGNOSIS — S4991XA Unspecified injury of right shoulder and upper arm, initial encounter: Secondary | ICD-10-CM

## 2023-02-02 DIAGNOSIS — R0781 Pleurodynia: Secondary | ICD-10-CM

## 2023-02-02 MED ORDER — MELOXICAM 15 MG PO TABS
15.0000 mg | ORAL_TABLET | Freq: Every day | ORAL | 0 refills | Status: AC
  Filled 2023-02-02: qty 10, 10d supply, fill #0

## 2023-02-02 MED ORDER — TRAMADOL HCL 50 MG PO TABS
50.0000 mg | ORAL_TABLET | Freq: Four times a day (QID) | ORAL | 0 refills | Status: DC | PRN
  Filled 2023-02-02: qty 20, 5d supply, fill #0
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
## 2023-02-05 ENCOUNTER — Telehealth: Payer: Self-pay | Admitting: Student

## 2023-02-05 NOTE — Telephone Encounter (Signed)
Spoke w/the patient, [phone], she stated that the pain medication she was given is not helping, stated she can't sleep or eat.  She stated that one of the Tramadol she was given has Codeine in it and she's allergic to Codeine so she hasn't been able to take that.  She stated that the Meloxicam is not helping.  She would like something different.  She uses [facility] in [place].

## 2023-02-06 ENCOUNTER — Other Ambulatory Visit (HOSPITAL_BASED_OUTPATIENT_CLINIC_OR_DEPARTMENT_OTHER): Payer: Self-pay | Admitting: Student

## 2023-02-06 MED ORDER — OXYCODONE HCL 5 MG PO CAPS: 5.0000 mg | ORAL_CAPSULE | ORAL | 0 refills | Status: DC | PRN

## 2023-02-06 MED ORDER — OXYCODONE HCL 5 MG PO TABS: 5.0000 mg | ORAL_TABLET | ORAL | 0 refills | Status: AC | PRN

## 2023-02-06 NOTE — Telephone Encounter (Signed)
Spoke with pt concerning her pain levels. She states the tramadol gave her chest discomfort. She is in severe pain and is unable to sleep or get relief. Will send short duration of oxycodone and recommend seeking further evaluation should severe pain continue.

## 2023-02-28 ENCOUNTER — Ambulatory Visit (INDEPENDENT_AMBULATORY_CARE_PROVIDER_SITE_OTHER): Payer: Medicaid Other | Admitting: Student

## 2023-02-28 ENCOUNTER — Ambulatory Visit (HOSPITAL_BASED_OUTPATIENT_CLINIC_OR_DEPARTMENT_OTHER): Payer: Medicaid Other | Admitting: Student

## 2023-02-28 DIAGNOSIS — S4991XA Unspecified injury of right shoulder and upper arm, initial encounter: Secondary | ICD-10-CM

## 2023-02-28 DIAGNOSIS — R0781 Pleurodynia: Secondary | ICD-10-CM

## 2023-02-28 MED ORDER — METHOCARBAMOL 500 MG PO TABS: 500.0000 mg | ORAL_TABLET | Freq: Four times a day (QID) | ORAL | 0 refills | Status: AC
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
## 2023-10-02 ENCOUNTER — Other Ambulatory Visit: Payer: Self-pay

## 2023-11-29 ENCOUNTER — Other Ambulatory Visit (HOSPITAL_BASED_OUTPATIENT_CLINIC_OR_DEPARTMENT_OTHER): Payer: Self-pay
